# Patient Record
Sex: Male | Born: 1970
Health system: Southern US, Community
[De-identification: ages and names within clinical notes are randomized; demographics above are authoritative.]

## PROBLEM LIST (undated history)

## (undated) DIAGNOSIS — I8390 Asymptomatic varicose veins of unspecified lower extremity: Secondary | ICD-10-CM

## (undated) HISTORY — PX: BACK SURGERY: SHX140

## (undated) HISTORY — PX: VARICOSE VEIN SURGERY: SHX832

## (undated) HISTORY — DX: Asymptomatic varicose veins of unspecified lower extremity: I83.90

---

## 2006-05-23 ENCOUNTER — Ambulatory Visit: Payer: Self-pay | Admitting: Internal Medicine

## 2006-05-23 LAB — CONVERTED CEMR LAB
ALT: 25 units/L (ref 0–40)
Calcium: 9.8 mg/dL (ref 8.4–10.5)
Chloride: 105 meq/L (ref 96–112)
Cholesterol: 164 mg/dL (ref 0–200)
Creatinine, Ser: 1.2 mg/dL (ref 0.4–1.5)
Glucose, Bld: 77 mg/dL (ref 70–99)
Hemoglobin: 14.7 g/dL (ref 13.0–17.0)
MCHC: 33.9 g/dL (ref 30.0–36.0)
MCV: 90.7 fL (ref 78.0–100.0)
Platelets: 209 10*3/uL (ref 150–400)
Potassium: 4.4 meq/L (ref 3.5–5.1)
RBC: 4.77 M/uL (ref 4.22–5.81)
TSH: 1.59 microintl units/mL (ref 0.35–5.50)

## 2009-02-10 ENCOUNTER — Ambulatory Visit: Payer: Self-pay | Admitting: Internal Medicine

## 2009-02-10 DIAGNOSIS — M549 Dorsalgia, unspecified: Secondary | ICD-10-CM | POA: Insufficient documentation

## 2009-02-10 LAB — CONVERTED CEMR LAB: Glucose, Bld: 88 mg/dL

## 2009-03-12 ENCOUNTER — Ambulatory Visit: Payer: Self-pay | Admitting: Internal Medicine

## 2009-03-17 ENCOUNTER — Encounter: Admission: RE | Admit: 2009-03-17 | Discharge: 2009-03-17 | Payer: Self-pay | Admitting: Internal Medicine

## 2009-03-20 ENCOUNTER — Ambulatory Visit: Payer: Self-pay | Admitting: Internal Medicine

## 2009-03-26 ENCOUNTER — Ambulatory Visit: Payer: Self-pay | Admitting: Internal Medicine

## 2009-03-31 ENCOUNTER — Encounter: Payer: Self-pay | Admitting: Internal Medicine

## 2009-03-31 LAB — CONVERTED CEMR LAB
BUN: 17 mg/dL (ref 6–23)
Cholesterol: 163 mg/dL (ref 0–200)
Creatinine, Ser: 1.1 mg/dL (ref 0.4–1.5)
Eosinophils Relative: 1.7 % (ref 0.0–5.0)
GFR calc non Af Amer: 80.31 mL/min (ref >60)
HCT: 40.9 % (ref 39.0–52.0)
HDL: 49.2 mg/dL (ref >39.00)
LDL Cholesterol: 108 mg/dL — ABNORMAL HIGH (ref 0–99)
Monocytes Relative: 7.2 % (ref 3.0–12.0)
Neutrophils Relative %: 60.3 % (ref 43.0–77.0)
Platelets: 180 10*3/uL (ref 150.0–400.0)
Potassium: 4.7 meq/L (ref 3.5–5.1)
Triglycerides: 30 mg/dL (ref 0.0–149.0)
VLDL: 6 mg/dL (ref 0.0–40.0)
WBC: 5.5 10*3/uL (ref 4.5–10.5)

## 2009-04-21 ENCOUNTER — Encounter: Payer: Self-pay | Admitting: Internal Medicine

## 2010-10-04 ENCOUNTER — Telehealth: Payer: Self-pay | Admitting: Internal Medicine

## 2010-10-04 NOTE — Telephone Encounter (Signed)
Left msg for return call

## 2010-10-04 NOTE — Telephone Encounter (Signed)
Spoke w/ pt informed that no appt for today advised UC pt ok'd information.

## 2010-10-04 NOTE — Telephone Encounter (Signed)
Patient has temp 101.9 - cough started yesterday - runny nose - muscle pain

## 2010-11-02 NOTE — Telephone Encounter (Signed)
error 

## 2011-02-14 ENCOUNTER — Encounter: Payer: Self-pay | Admitting: Internal Medicine

## 2011-03-02 ENCOUNTER — Encounter: Payer: Self-pay | Admitting: Internal Medicine

## 2011-03-02 ENCOUNTER — Ambulatory Visit (INDEPENDENT_AMBULATORY_CARE_PROVIDER_SITE_OTHER): Payer: BC Managed Care – PPO | Admitting: Internal Medicine

## 2011-03-02 DIAGNOSIS — M549 Dorsalgia, unspecified: Secondary | ICD-10-CM

## 2011-03-02 DIAGNOSIS — I8390 Asymptomatic varicose veins of unspecified lower extremity: Secondary | ICD-10-CM | POA: Insufficient documentation

## 2011-03-02 DIAGNOSIS — Z0000 Encounter for general adult medical examination without abnormal findings: Secondary | ICD-10-CM

## 2011-03-02 MED ORDER — PREDNISONE 10 MG PO TABS: ORAL_TABLET | ORAL | Status: AC

## 2011-03-02 NOTE — Progress Notes (Signed)
  Subjective:    Patient ID: Jeffery Rose, male    DOB: 03-11-1971, 40 y.o.   MRN: [id number]  HPI  CPX   Past Medical History: Varicose veins R leg , onset about 2005  Past Surgical History: no major surgeries   Family History: DM - no CAD - no stroke - no HTN - no colon Ca - no prostate Ca - no both parents alive & well  Social History: Married 1 child Occupation: Sports administrator Biomedical engineer) Former Smoker  Alcohol use-yes (couple beers weekly) Drug use-no Regular exercise--some    Review of Systems  Respiratory: Negative for cough and shortness of breath.   Cardiovascular: Negative for chest pain.  Gastrointestinal: Negative for nausea, vomiting, diarrhea and blood in stool.  Genitourinary: Negative for dysuria, hematuria and difficulty urinating.  long h/o varicose veins, lately more prominent, itching some, no bleed Also had mechanical back pain, saw ortho 2010, had a MRI, was Rx PT, did better x 1.5 years, lately having some aches, L sided, some radiation to L buttock and leg; doing self PT    Objective:   Physical Exam  Constitutional: He is oriented to person, place, and time. He appears well-developed. No distress.  HENT:  Head: Normocephalic and atraumatic.  Neck: No thyromegaly present.  Cardiovascular: Normal rate, regular rhythm and normal heart sounds.   No murmur heard.      Nl femoral and pedal pulses   Pulmonary/Chest: Effort normal and breath sounds normal. No respiratory distress. He has no wheezes. He has no rales.  Abdominal: Soft. Bowel sounds are normal. He exhibits no distension. There is no tenderness. There is no rebound and no guarding.  Musculoskeletal: He exhibits no edema.       Prominent varicose veins around R knee, no phlebitis on exam  Neurological: He is alert and oriented to person, place, and time.       LE motor and DTRs symetric, ? Of + streight leg test on the L  Skin: Skin is warm and dry. He is not diaphoretic.    Psychiatric: He has a normal mood and affect. His behavior is normal. Judgment and thought content normal.          Assessment & Plan:

## 2011-03-02 NOTE — Assessment & Plan Note (Addendum)
Offered referal to intervention radiology

## 2011-03-02 NOTE — Assessment & Plan Note (Addendum)
Td 07 Labs Diet exercise discussed , ability to exercise may be limited by back ache

## 2011-03-02 NOTE — Patient Instructions (Addendum)
Came back fasting within a week: FLP CMP CBC TSH---dx v70 See Dr Leandra Kern if the back is not better

## 2011-03-02 NOTE — Assessment & Plan Note (Addendum)
Rx prednisone, tylenol, heat;, to see ortho again Dr Leandra Kern again if sx persist

## 2011-03-03 ENCOUNTER — Other Ambulatory Visit: Payer: Self-pay | Admitting: Internal Medicine

## 2011-03-03 ENCOUNTER — Telehealth: Payer: Self-pay | Admitting: Internal Medicine

## 2011-03-03 DIAGNOSIS — I8390 Asymptomatic varicose veins of unspecified lower extremity: Secondary | ICD-10-CM

## 2011-03-03 DIAGNOSIS — Z0000 Encounter for general adult medical examination without abnormal findings: Secondary | ICD-10-CM

## 2011-03-03 DIAGNOSIS — E039 Hypothyroidism, unspecified: Secondary | ICD-10-CM

## 2011-03-03 DIAGNOSIS — E8881 Metabolic syndrome: Secondary | ICD-10-CM

## 2011-03-03 NOTE — Telephone Encounter (Signed)
done

## 2011-03-04 ENCOUNTER — Other Ambulatory Visit (INDEPENDENT_AMBULATORY_CARE_PROVIDER_SITE_OTHER): Payer: BC Managed Care – PPO

## 2011-03-04 DIAGNOSIS — Z0000 Encounter for general adult medical examination without abnormal findings: Secondary | ICD-10-CM

## 2011-03-04 LAB — LIPID PANEL
HDL: 52.4 mg/dL (ref >39.00)
Total CHOL/HDL Ratio: 3
VLDL: 4.2 mg/dL (ref 0.0–40.0)

## 2011-03-04 LAB — TSH: TSH: 1.1 u[IU]/mL (ref 0.35–5.50)

## 2011-03-04 LAB — CBC WITH DIFFERENTIAL/PLATELET
Eosinophils Relative: 2.3 % (ref 0.0–5.0)
HCT: 41.6 % (ref 39.0–52.0)
Lymphs Abs: 1.6 10*3/uL (ref 0.7–4.0)
MCV: 90.8 fl (ref 78.0–100.0)
Monocytes Absolute: 0.5 10*3/uL (ref 0.1–1.0)
Platelets: 195 10*3/uL (ref 150.0–400.0)
WBC: 5.8 10*3/uL (ref 4.5–10.5)

## 2011-03-04 LAB — COMPREHENSIVE METABOLIC PANEL
ALT: 21 U/L (ref 0–53)
Alkaline Phosphatase: 55 U/L (ref 39–117)
Glucose, Bld: 98 mg/dL (ref 70–99)
Sodium: 140 mEq/L (ref 135–145)
Total Bilirubin: 0.9 mg/dL (ref 0.3–1.2)
Total Protein: 7.3 g/dL (ref 6.0–8.3)

## 2011-03-04 NOTE — Progress Notes (Signed)
Labs only

## 2011-03-29 ENCOUNTER — Ambulatory Visit
Admission: RE | Admit: 2011-03-29 | Discharge: 2011-03-29 | Disposition: A | Discharge status: Home / Self Care | Payer: BC Managed Care – PPO | Source: Ambulatory Visit | Attending: Internal Medicine | Admitting: Internal Medicine

## 2011-03-29 ENCOUNTER — Other Ambulatory Visit: Payer: BC Managed Care – PPO

## 2011-03-29 DIAGNOSIS — I8390 Asymptomatic varicose veins of unspecified lower extremity: Secondary | ICD-10-CM

## 2011-03-30 ENCOUNTER — Other Ambulatory Visit: Payer: Self-pay | Admitting: Interventional Radiology

## 2011-03-30 DIAGNOSIS — I83811 Varicose veins of right lower extremities with pain: Secondary | ICD-10-CM

## 2011-05-27 ENCOUNTER — Ambulatory Visit (INDEPENDENT_AMBULATORY_CARE_PROVIDER_SITE_OTHER): Payer: BC Managed Care – PPO | Admitting: Internal Medicine

## 2011-05-27 ENCOUNTER — Encounter: Payer: Self-pay | Admitting: Internal Medicine

## 2011-05-27 VITALS — BP 122/76 | HR 97 | Temp 99.3°F | Wt 172.6 lb

## 2011-05-27 DIAGNOSIS — R509 Fever, unspecified: Secondary | ICD-10-CM

## 2011-05-27 DIAGNOSIS — R112 Nausea with vomiting, unspecified: Secondary | ICD-10-CM

## 2011-05-27 DIAGNOSIS — J069 Acute upper respiratory infection, unspecified: Secondary | ICD-10-CM

## 2011-05-27 DIAGNOSIS — J029 Acute pharyngitis, unspecified: Secondary | ICD-10-CM

## 2011-05-27 LAB — CBC WITH DIFFERENTIAL/PLATELET
Basophils Relative: 0 % (ref 0–1)
Eosinophils Absolute: 0 10*3/uL (ref 0.0–0.7)
Eosinophils Relative: 0 % (ref 0–5)
HCT: 39 % (ref 39.0–52.0)
Hemoglobin: 13.1 g/dL (ref 13.0–17.0)
MCH: 30.4 pg (ref 26.0–34.0)
MCHC: 33.6 g/dL (ref 30.0–36.0)
MCV: 90.5 fL (ref 78.0–100.0)
Monocytes Absolute: 0.5 10*3/uL (ref 0.1–1.0)
Monocytes Relative: 5 % (ref 3–12)
Neutrophils Relative %: 86 % — ABNORMAL HIGH (ref 43–77)

## 2011-05-27 MED ORDER — AMOXICILLIN-POT CLAVULANATE 875-125 MG PO TABS: 1.0000 | ORAL_TABLET | Freq: Two times a day (BID) | ORAL | Status: AC

## 2011-05-27 NOTE — Patient Instructions (Signed)
Plain Mucinex for thick secretions ;force NON dairy fluids for next 48 hrs. Use a Neti pot daily as needed for sinus congestion 

## 2011-05-27 NOTE — Progress Notes (Signed)
  Subjective:    Patient ID: Jeffery Rose, male    DOB: 1970-08-15, 40 y.o.   MRN: [id number]  HPI Respiratory tract infection Onset/symptoms:11/22 nasal congestion & ST  upon awakening Exposures (illness/environmental/extrinsic):son ill recently with gland swelling Progression of symptoms:glands in neck swollen 11/22 afternoon Treatments/response:NSAIDS w/o benefit ;N&V 3-4 X after NSAIDS ; he has not had flu shot            Present symptoms: Fever/chills/sweats:only temp to 101 Frontal headache:no Facial pain:yes Nasal purulence:yellow Sore throat:yes Dental pain:no Wheezing/shortness of breath:no Cough/sputum:no            Review of Systems   The nausea and vomiting have resolved. He denies diarrhea, dysuria, pyuria, or hematuria. He also denies significant arthralgias or myalgias. His major complaint presence of severe weakness.  He seen an orthopedist for degenerative disc disease. This has been associated with intermittent radicular pain down the left leg. This is exacerbated with this present illness.  He's been taking amoxicillin for dental work; his last dose was 5 days ago     Objective:   Physical Exam General appearance : appears mildly ill; no acute distress or increased work of breathing is present.  No  lymphadenopathy about the head, neck, or axilla noted.   Eyes: No conjunctival inflammation or lid edema is present. There is no scleral icterus.  Ears:  External ear exam shows no significant lesions or deformities.  Otoscopic examination reveals clear canals, tympanic membranes are intact bilaterally without bulging, retraction, inflammation or discharge.TMs dull  Nose:  External nasal examination shows no deformity or inflammation. Nasal mucosa are dry without lesions or exudates. No septal dislocation .No obstruction to airflow.   Oral exam: Dental hygiene is good; lips and gums are healthy appearing.There is mild - moderate oropharyngeal erythema but   exudate noted.   Neck:  No deformities,  masses, or tenderness noted.   Supple with full range of motion without pain. Both passive and active range of motion is negative for meningismus  Heart:  Normal rate and regular rhythm. S1 and S2 normal without gallop, murmur, click, rub or other extra sounds.   Lungs:Chest clear to auscultation; no wheezes, rhonchi,rales ,or rubs present.No increased work of breathing.    MS/Extremities:  No cyanosis, edema, or clubbing  noted. No significant joint symptoms are present. Straight leg raising is negative. He is able to lie back and sit up without help. He is wearing a back brace   Skin: Warm but damp  w/o jaundice or tenting.          Assessment & Plan:  #1 upper respiratory tract infection with congestion, nasal purulence and pharyngitis.Beta strep negative.  #2 nausea and vomiting after nonsteroidal ingestion.  #3 flare of radicular pain left leg present illness. Negative straight leg raising.  #4 No myalgias, arthralgias or cough  to suggest influenza; he has not had flu shot.

## 2011-06-13 ENCOUNTER — Telehealth: Payer: Self-pay | Admitting: Emergency Medicine

## 2011-06-13 NOTE — Telephone Encounter (Signed)
CALLED PT TO SET UP F/U CONSERVATIVE TX FOR VEINS W/ DR SHICK, PT IS HAVING PROBLEMS WITH HIS BACK AND WILL CONTACT us WHEN READY TO SET UP F/U APPT.

## 2011-06-15 ENCOUNTER — Telehealth: Payer: Self-pay | Admitting: Radiology

## 2011-06-15 NOTE — Telephone Encounter (Signed)
Called pt to schedule 3 mo f/u app't for conservative Rx of symptomatic varicose veins of RLE.  Pt states that he will call back if/when ready to schedule app't.

## 2011-06-30 ENCOUNTER — Telehealth: Payer: Self-pay | Admitting: Emergency Medicine

## 2011-06-30 NOTE — Telephone Encounter (Signed)
CALLED PT TO SET UP F/U CONSERVATIVE TX W/ DR [facility] FOR VARICOSE VEINS.   HE WILL CALL TO SET UP APPT AFTER HE TAKES CARE OF HIS BACK PROBLEMS.

## 2011-07-07 ENCOUNTER — Telehealth: Payer: Self-pay

## 2011-07-07 NOTE — Telephone Encounter (Signed)
Pt left a message stating he is hurting and not sleeping? Pt stated he has chills and feels like he may have an ear infection? I called patient back and informed him that MD's schedule is full today but we could get an appt at [facility]. Pt stated no thanks that's ok I will go some where else. I informed him I was sorry and we could get him set up at another location and pt stated no that's okay.

## 2013-06-11 ENCOUNTER — Encounter: Payer: Self-pay | Admitting: Internal Medicine

## 2013-06-11 ENCOUNTER — Ambulatory Visit (INDEPENDENT_AMBULATORY_CARE_PROVIDER_SITE_OTHER): Payer: Managed Care, Other (non HMO) | Admitting: Internal Medicine

## 2013-06-11 VITALS — BP 102/64 | HR 65 | Temp 97.5°F | Ht 70.0 in | Wt 177.0 lb

## 2013-06-11 DIAGNOSIS — Z0000 Encounter for general adult medical examination without abnormal findings: Secondary | ICD-10-CM

## 2013-06-11 DIAGNOSIS — Z23 Encounter for immunization: Secondary | ICD-10-CM

## 2013-06-11 NOTE — Progress Notes (Signed)
Pre visit review using our clinic review tool, if applicable. No additional management support is needed unless otherwise documented below in the visit note. 

## 2013-06-11 NOTE — Patient Instructions (Signed)
Please make an appointment for blood work, FLP, CMP, CBC, TSH---- dx v70   Next visit for a physical exam   fasting, in  1 year Please make an appointment

## 2013-06-11 NOTE — Progress Notes (Signed)
   Subjective:    Patient ID: Jeffery Rose, male    DOB: 1970/07/25, 42 y.o.   MRN: [id number]  HPI CPX  Past Medical History  Diagnosis Date  . Varicose veins     R leg    Past Surgical History  Procedure Laterality Date  . Back surgery      2012, Dr Danielle Dess  . Varicose vein surgery      R, 2013   History   Social History  . Marital Status: Married    Spouse Name: N/A    Number of Children: 1  . Years of Education: N/A   Occupational History  . restaurant owner     Social History Main Topics  . Smoking status: Former Games developer  . Smokeless tobacco: Never Used  . Alcohol Use: Yes     Comment: couple beers weekly  . Drug Use: No  . Sexual Activity: Not on file   Other Topics Concern  . Not on file   Social History Narrative   Original from [place], parents still there            Family History: DM - no CAD - no stroke - no HTN - no colon Ca - no prostate Ca - no both parents alive & well     Review of Systems Diet-- trying to eat healthy Exercise-- trying to exercise but unable to be consistent No  CP, SOB, lower extremity edema Denies  nausea, vomiting diarrhea Denies  blood in the stools (-) cough, sputum production, (-) wheezing  No dysuria, gross hematuria, difficulty urinating   No anxiety, depression      Objective:   Physical Exam BP 102/64  Pulse 65  Temp(Src) 97.5 F (36.4 C)  Ht 5\' 10"  (1.778 m)  Wt 177 lb (80.287 kg)  BMI 25.40 kg/m2  SpO2 97% General -- alert, well-developed, NAD.  Neck --no thyromegaly Lungs -- normal respiratory effort, no intercostal retractions, no accessory muscle use, and normal breath sounds.  Heart-- normal rate, regular rhythm, no murmur.  Abdomen-- Not distended, good bowel sounds,soft, non-tender. Extremities-- no pretibial edema bilaterally  Neurologic--  alert & oriented X3. Speech normal, gait normal, strength normal in all extremities.  Psych-- Cognition and judgment appear intact. Cooperative  with normal attention span and concentration. No anxious appearing , no depressed appearing.      Assessment & Plan:

## 2013-06-11 NOTE — Assessment & Plan Note (Addendum)
Td 07 Flu shot today Never had a cscope  EKG nsr Labs Diet exercise discussed   STE discussed as well

## 2013-06-12 ENCOUNTER — Other Ambulatory Visit (INDEPENDENT_AMBULATORY_CARE_PROVIDER_SITE_OTHER): Payer: Managed Care, Other (non HMO)

## 2013-06-12 DIAGNOSIS — Z0000 Encounter for general adult medical examination without abnormal findings: Secondary | ICD-10-CM

## 2013-06-12 LAB — COMPREHENSIVE METABOLIC PANEL
ALT: 25 U/L (ref 0–53)
AST: 21 U/L (ref 0–37)
Albumin: 4.3 g/dL (ref 3.5–5.2)
Alkaline Phosphatase: 49 U/L (ref 39–117)
Calcium: 8.7 mg/dL (ref 8.4–10.5)
Chloride: 105 mEq/L (ref 96–112)
Potassium: 4.5 mEq/L (ref 3.5–5.1)
Sodium: 140 mEq/L (ref 135–145)

## 2013-06-12 LAB — CBC WITH DIFFERENTIAL/PLATELET
Basophils Absolute: 0 10*3/uL (ref 0.0–0.1)
Eosinophils Absolute: 0.1 10*3/uL (ref 0.0–0.7)
Hemoglobin: 13.4 g/dL (ref 13.0–17.0)
Lymphocytes Relative: 15.6 % (ref 12.0–46.0)
MCHC: 33.4 g/dL (ref 30.0–36.0)
MCV: 89.9 fl (ref 78.0–100.0)
Monocytes Absolute: 0.4 10*3/uL (ref 0.1–1.0)
Neutro Abs: 3.4 10*3/uL (ref 1.4–7.7)
Neutrophils Relative %: 73.9 % (ref 43.0–77.0)
RDW: 12.9 % (ref 11.5–14.6)

## 2013-06-12 LAB — LIPID PANEL
LDL Cholesterol: 111 mg/dL — ABNORMAL HIGH (ref 0–99)
Total CHOL/HDL Ratio: 3

## 2013-06-17 ENCOUNTER — Encounter: Payer: Self-pay | Admitting: *Deleted

## 2013-06-25 IMAGING — US IR EVAL AND MANAGEMENT
1 series · 13 of 24 positions shown · non-contrast
Comparison: none

NEW PATIENT OFFICE VISIT - LEVEL II ([id number])

Chief Complaint:  Right lower extremity enlarging painful varicose
veins.
HISTORY: 40-year-old healthy male with chronic right lower
extremity varicose veins.  The right leg varicosities have enlarged
over the last 6 months and have become painful.  He has had no
prior treatments for varicose veins or spider veins.  No history of
DVT or thrombophlebitis.  No prior treatments or surgeries for
varicose veins.  He is a local restaurant owner and works very long
hours standing for approximately 10-12 hours a day.  He reports
slow progression of right lower extremity pain, fatigue and heavy
sensation.  There is associated itching, edema and restless leg
syndrome.  No pain relief with elevation.  He currently is not
wearing prescription strength compression stockings.  He takes
occasional Advil for pain relief.

[Series 1: ir eval and management · 13 of 24 slices shown]
[im 1/24]
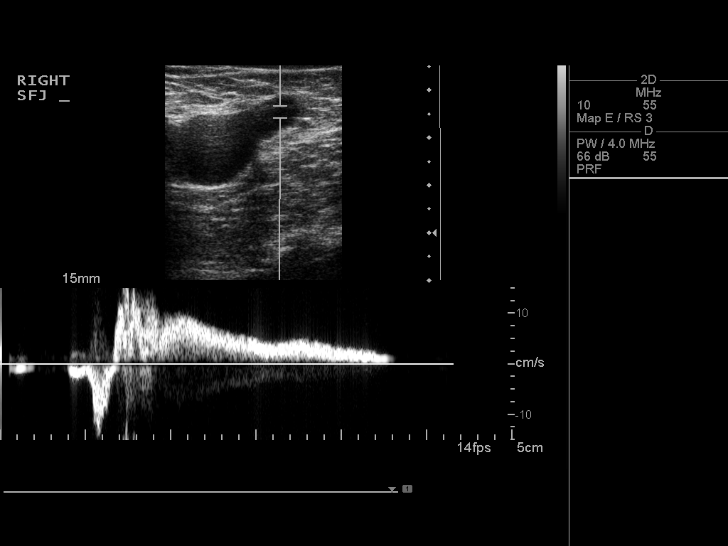
[im 3/24]
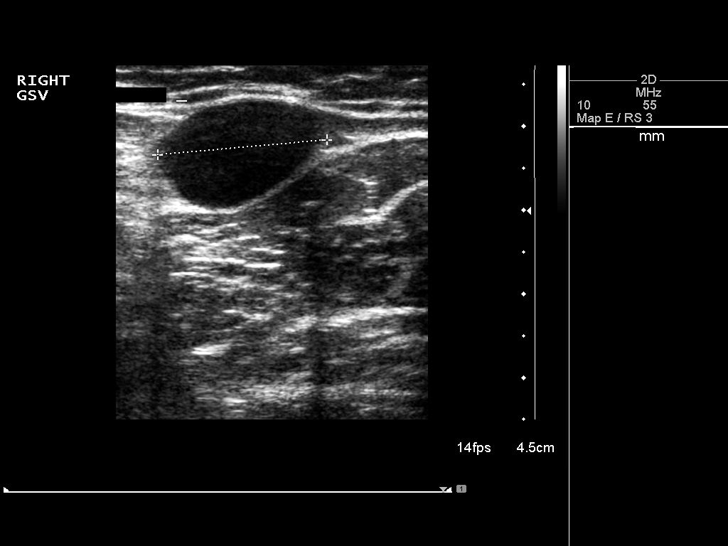
[im 5/24]
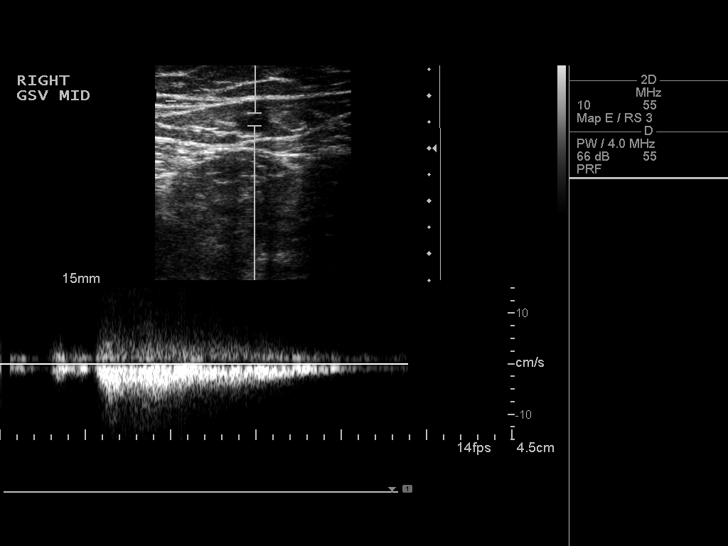
[im 7/24]
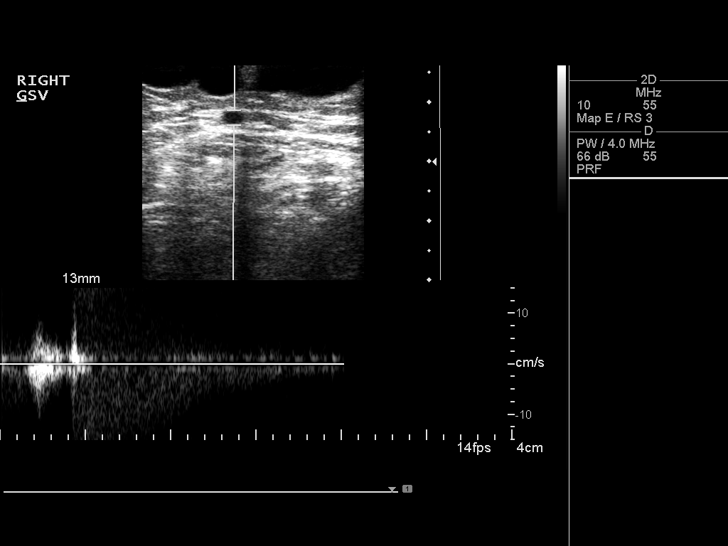
[im 9/24]
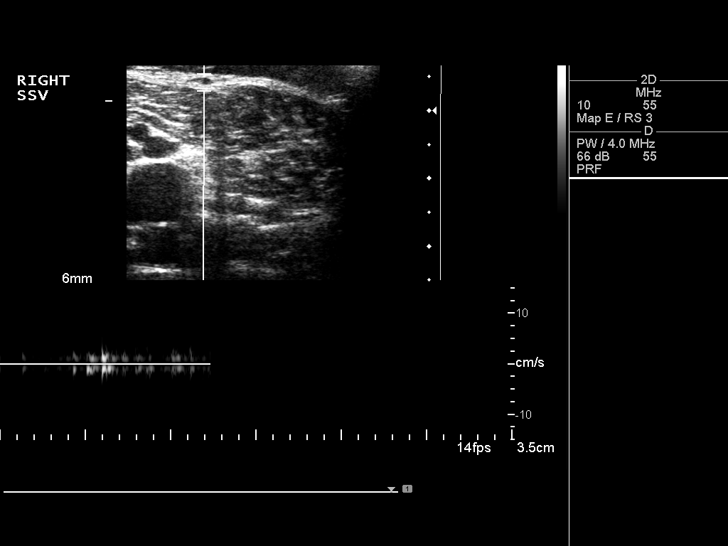
[im 11/24]
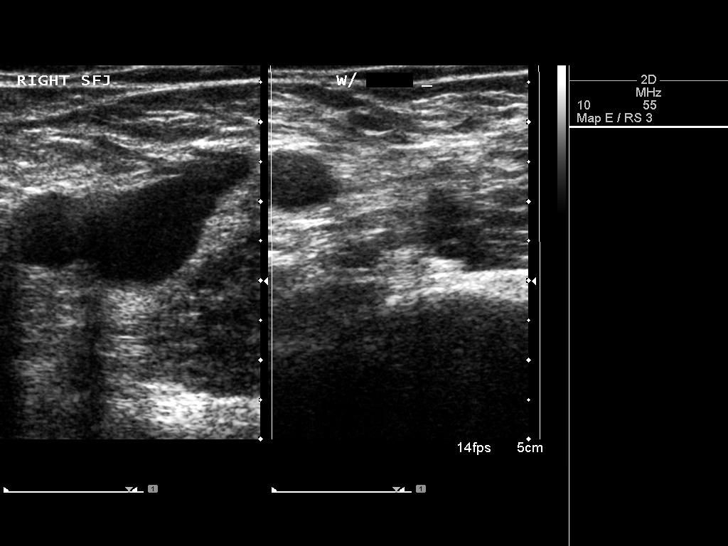
[im 13/24]
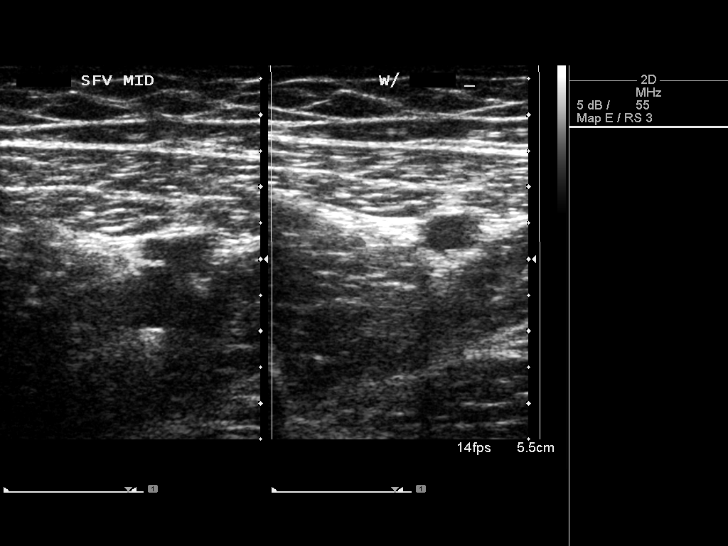
[im 14/24]
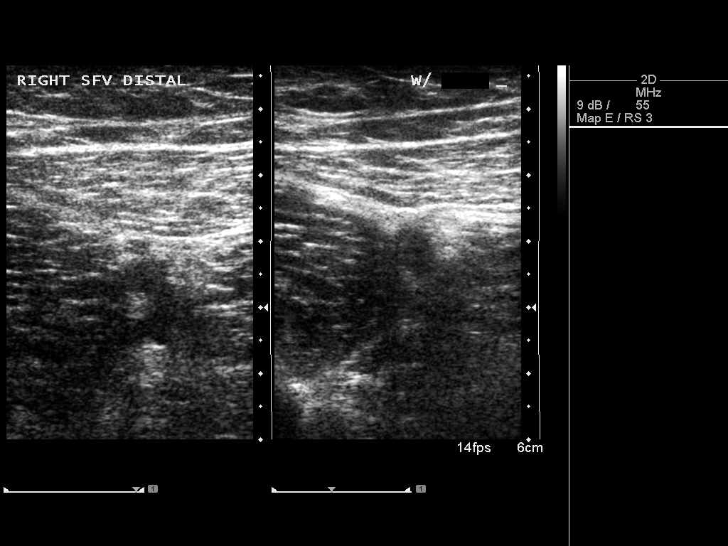
[im 16/24]
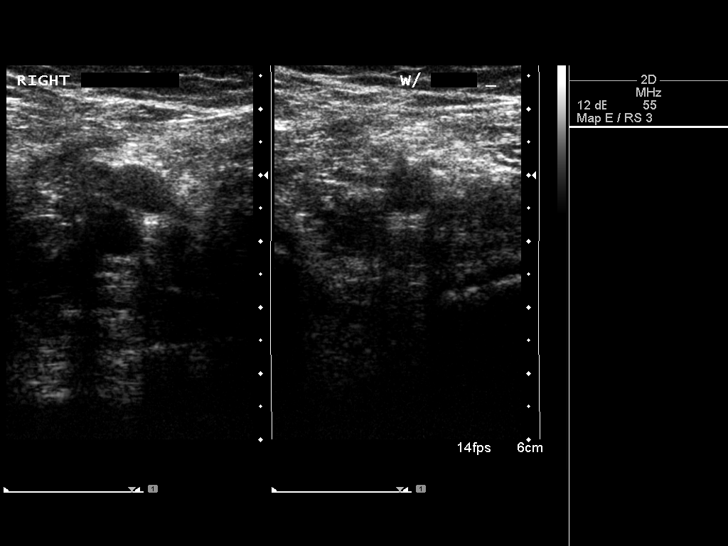
[im 18/24]
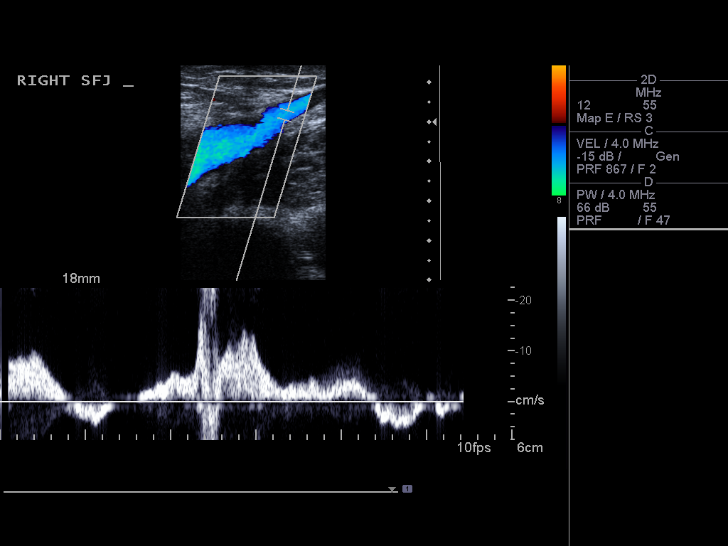
[im 20/24]
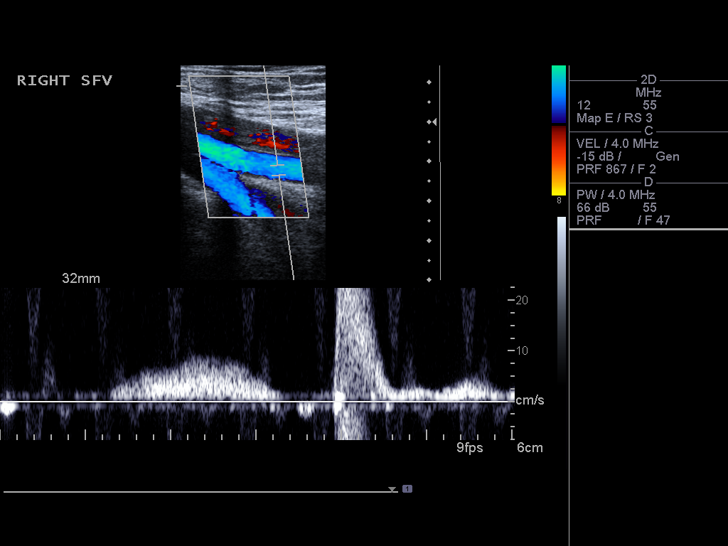
[im 22/24]
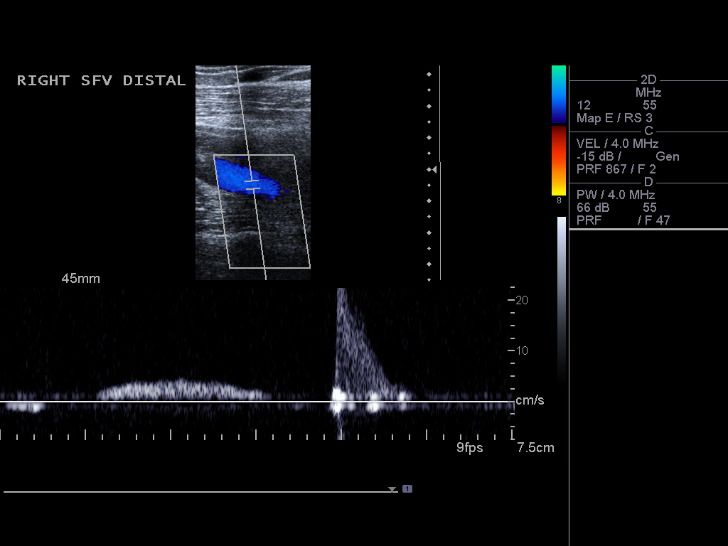
[im 24/24]
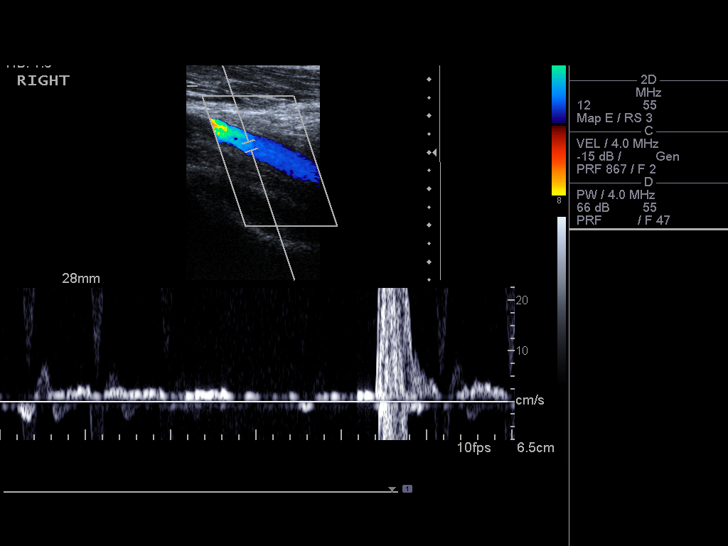

[13 of 24 positions shown; findings below may reference images not displayed]

Past Medical History:  Negative

Medications:   daily multivitamin

Allergies:  No known drug allergies

Social History:  Married with one child.  Resides in [HOSPITAL].
Local restaurant owner.  Occasional alcohol.  Remote smoking
history.  Daily caffeine intake.

Family History:  Negative

Review of Systems:  Positive for the progressive right lower
extremity pain and enlarging varicosities.

Exam:

Right lower extremity:  Tortuous mildly tender compressible
varicosities noted in the medial right distal thigh extending along
the anterior and posterior calf.  No active thrombophlebitis.  No
significant peripheral edema.  These are the patient's symptomatic
varicosities.  Normal pedal pulses.

Left lower extremity:  Negative for varicosities.

Ultrasound:  This was dictated separately but confirms right G S V
venous insufficiency with an incompetent saphenofemoral junction
resulting in the symptomatic enlarging right lower extremity
varicosities.  Negative for DVT.

Today, I spent approximately 30 minutes with the the patient and in
detail reviewed the physical exam and ultrasound findings.  We
reviewed the treatment options including transcatheter laser
occlusion of the right G S V refluxing segment and injection
sclerotherapy of the varicosities.  We reviewed the risks,
benefits, expected goals, and outcomes.  We also reviewed
phlebectomy if this was necessary in the future.  He understands
the treatment options and would like to proceed with treatment
because of the progressive symptoms over last 6 months.  .

Assessment and Plan:  Significant right G S V venous insufficiency
with an incompetent saphenofemoral junction and developing
progressive right lower extremity painful varicosities.

Plan:  Initiate conservative management trial with 20/30 mmHg
compression stockings, daily exercise, and avoiding precipitating
factors.  The patient is a candidate for transcatheter laser
occlusion and varicose vein injection sclerotherapy.

## 2013-08-05 ENCOUNTER — Ambulatory Visit: Payer: Managed Care, Other (non HMO) | Admitting: Internal Medicine

## 2013-08-12 ENCOUNTER — Encounter: Payer: Self-pay | Admitting: Internal Medicine

## 2013-08-12 ENCOUNTER — Ambulatory Visit (INDEPENDENT_AMBULATORY_CARE_PROVIDER_SITE_OTHER): Payer: Managed Care, Other (non HMO) | Admitting: Internal Medicine

## 2013-08-12 VITALS — BP 100/66 | HR 72 | Temp 97.6°F | Wt 173.0 lb

## 2013-08-12 DIAGNOSIS — M549 Dorsalgia, unspecified: Secondary | ICD-10-CM

## 2013-08-12 DIAGNOSIS — B9789 Other viral agents as the cause of diseases classified elsewhere: Secondary | ICD-10-CM

## 2013-08-12 DIAGNOSIS — B349 Viral infection, unspecified: Secondary | ICD-10-CM

## 2013-08-12 NOTE — Assessment & Plan Note (Signed)
Takes Advil OTC as needed, daily. Diet is okay as long as he doesn't have GI side effects which were discussed with him. Take medication with food

## 2013-08-12 NOTE — Progress Notes (Signed)
   Subjective:    Patient ID: Jeffery Rose, male    DOB: 1971-05-21, 43 y.o.   MRN: [id number]  DOS:  08/12/2013 Acute visit  ,we discussed the following issues  Developed a cold 4 weeks ago: Intense, abundant mucus production, he took OTC  Mucinex, feels better particularly in the last few days, still has mild chest congestion and likes to be sure his not having something serious. Also take Advil once daily for back ache, is that okay?.   Past Medical History  Diagnosis Date  . Varicose veins     R leg     Past Surgical History  Procedure Laterality Date  . Back surgery      2012, Dr Danielle DessElsner  . Varicose vein surgery      R, 2013    History   Social History  . Marital Status: Married    Spouse Name: N/A    Number of Children: 1  . Years of Education: N/A   Occupational History  . restaurant owner     Social History Main Topics  . Smoking status: Former Games developermoker  . Smokeless tobacco: Never Used  . Alcohol Use: Yes     Comment: couple beers weekly  . Drug Use: No  . Sexual Activity: Not on file   Other Topics Concern  . Not on file   Social History Narrative   Original from [place], parents still there             ROS Did not have fever or chills. No nausea, vomiting, diarrhea. No myalgias.      Objective:   Physical Exam BP 100/66  Pulse 72  Temp(Src) 97.6 F (36.4 C)  Wt 173 lb (78.472 kg)  SpO2 96% General -- alert, well-developed, NAD.   HEENT-- Not pale. TM R normal, L: covered by wax, bi d/c. Throat symmetric, no redness or discharge. Face symmetric, sinuses not tender to palpation. Nose not congested.  Lungs -- normal respiratory effort, no intercostal retractions, no accessory muscle use, and normal breath sounds.  Heart-- normal rate, regular rhythm, no murmur.   Extremities-- no pretibial edema bilaterally  Neurologic--  alert & oriented X3. Speech normal, gait normal, strength normal in all extremities.  Psych-- Cognition and judgment  appear intact. Cooperative with normal attention span and concentration. No anxious or depressed appearing.     Assessment & Plan:   Viral syndrome, resolving, to take Mucinex /nasal spray for residual symptoms, if not completely well in few days patient will call us.

## 2013-08-12 NOTE — Patient Instructions (Signed)
If  cough, take Mucinex DM twice a day as needed  If  congestion use OTC Nasocort: 2 nasal sprays on each side of the nose daily until you feel better Call if no back to normal in few days

## 2013-08-12 NOTE — Progress Notes (Signed)
Pre visit review using our clinic review tool, if applicable. No additional management support is needed unless otherwise documented below in the visit note. 

## 2015-06-25 ENCOUNTER — Ambulatory Visit (INDEPENDENT_AMBULATORY_CARE_PROVIDER_SITE_OTHER): Payer: PPO | Admitting: Family Medicine

## 2015-06-25 ENCOUNTER — Encounter: Payer: Self-pay | Admitting: Family Medicine

## 2015-06-25 VITALS — BP 108/68 | HR 74 | Temp 98.6°F | Wt 178.4 lb

## 2015-06-25 DIAGNOSIS — J0110 Acute frontal sinusitis, unspecified: Secondary | ICD-10-CM

## 2015-06-25 DIAGNOSIS — R05 Cough: Secondary | ICD-10-CM

## 2015-06-25 DIAGNOSIS — R059 Cough, unspecified: Secondary | ICD-10-CM

## 2015-06-25 MED ORDER — GUAIFENESIN-CODEINE 100-10 MG/5ML PO SYRP: ORAL_SOLUTION | ORAL | Status: DC

## 2015-06-25 MED ORDER — LEVOCETIRIZINE DIHYDROCHLORIDE 5 MG PO TABS: 5.0000 mg | ORAL_TABLET | Freq: Every evening | ORAL | Status: DC

## 2015-06-25 MED ORDER — AMOXICILLIN-POT CLAVULANATE 875-125 MG PO TABS: 1.0000 | ORAL_TABLET | Freq: Two times a day (BID) | ORAL | Status: DC

## 2015-06-25 NOTE — Patient Instructions (Signed)

## 2015-06-25 NOTE — Progress Notes (Signed)
Check labs.  Adjust meds prnCheck labs.  Adjust meds prnCheck labs.  Adjust meds prn Patient ID: Jeffery Rose, male    DOB: 1971/01/02  Age: 44 y.o. MRN: [id number]    Subjective:  Subjective HPI Jeffery Rose presents for c/o cough and congestion x 1 week.  Cough is keeping him up  Review of Systems  Constitutional: Positive for chills. Negative for fever.  HENT: Positive for congestion, postnasal drip, rhinorrhea and sinus pressure.   Respiratory: Positive for cough, chest tightness, shortness of breath and wheezing.   Cardiovascular: Negative for chest pain, palpitations and leg swelling.  Allergic/Immunologic: Negative for environmental allergies.    History Past Medical History  Diagnosis Date  . Varicose veins     R leg     He has past surgical history that includes Back surgery and Varicose vein surgery.   His family history is negative for Diabetes, Coronary artery disease, Stroke, Hypertension, Colon cancer, and Prostate cancer.He reports that he has quit smoking. He has never used smokeless tobacco. He reports that he drinks alcohol. He reports that he does not use illicit drugs.  Current Outpatient Prescriptions on File Prior to Visit  Medication Sig Dispense Refill  . Multiple Vitamins-Minerals (ADULT GUMMY) CHEW Chew 2 each by mouth daily.       No current facility-administered medications on file prior to visit.     Objective:  Objective Physical Exam  Constitutional: He is oriented to person, place, and time. He appears well-developed and well-nourished. No distress.  HENT:  Head: Normocephalic and atraumatic.  Right Ear: External ear normal.  Left Ear: External ear normal.  Nose: Right sinus exhibits maxillary sinus tenderness and frontal sinus tenderness. Left sinus exhibits maxillary sinus tenderness and frontal sinus tenderness.  Mouth/Throat: Posterior oropharyngeal erythema present. No oropharyngeal exudate or posterior oropharyngeal edema.  +  PND + errythema  Eyes: Conjunctivae and EOM are normal. Pupils are equal, round, and reactive to light. Right eye exhibits no discharge. Left eye exhibits no discharge.  Neck: Normal range of motion. Neck supple. No JVD present. No thyromegaly present.  Cardiovascular: Normal rate, regular rhythm, normal heart sounds and intact distal pulses.  Exam reveals no gallop and no friction rub.   No murmur heard. Pulmonary/Chest: Effort normal and breath sounds normal. No respiratory distress. He has no wheezes. He has no rales. He exhibits no tenderness.  Abdominal: Soft. Bowel sounds are normal. He exhibits no distension and no mass. There is no tenderness. There is no rebound and no guarding.  Genitourinary: Rectum normal, prostate normal and penis normal. Guaiac negative stool.  Musculoskeletal: Normal range of motion. He exhibits no edema or tenderness.  Lymphadenopathy:    He has no cervical adenopathy.  Neurological: He is alert and oriented to person, place, and time. He displays normal reflexes. He exhibits normal muscle tone.  Skin: Skin is warm and dry. No rash noted. He is not diaphoretic. No erythema. No pallor.  Psychiatric: He has a normal mood and affect. His behavior is normal. Judgment and thought content normal.   BP 108/68 mmHg  Pulse 74  Temp(Src) 98.6 F (37 C) (Oral)  Wt 178 lb 6.4 oz (80.922 kg)  SpO2 97% Wt Readings from Last 3 Encounters:  06/25/15 178 lb 6.4 oz (80.922 kg)  08/12/13 173 lb (78.472 kg)  06/11/13 177 lb (80.287 kg)     Lab Results  Component Value Date   WBC 4.7 06/12/2013   HGB 13.4 06/12/2013   HCT 40.1  06/12/2013   PLT 159.0 06/12/2013   GLUCOSE 98 06/12/2013   CHOL 168 06/12/2013   TRIG 32.0 06/12/2013   HDL 50.70 06/12/2013   LDLCALC 111* 06/12/2013   ALT 25 06/12/2013   AST 21 06/12/2013   NA 140 06/12/2013   K 4.5 06/12/2013   CL 105 06/12/2013   CREATININE 1.1 06/12/2013   BUN 16 06/12/2013   CO2 29 06/12/2013   TSH 1.32  06/12/2013    US Venous Img Lower Unilateral Right  03/29/2011  *RADIOLOGY REPORT* Clinical Data: Right lower extremity enlarging symptomatic varicose veins RIGHT LOWER EXTREMITY VENOUS DUPLEX ULTRASOUND Technique:  Gray-scale sonography with graded compression, as well as color Doppler and duplex ultrasound, were performed to evaluate the deep and superficial veins of the right lower extremity. Spectral Doppler was utilized to evaluate flow at rest and with distal augmentation maneuvers.  A complete superficial venous insufficiency exam was performed in the upright standing position. I personally performed the technical portion of the exam. Comparison:  None. Findings: Right Lower Extremity:  Right saphenofemoral junction is dilated and incompetent with venous insufficiency.  Right G S V is dilated proximally to a maximal diameter of 2 cm with diffuse venous insufficiency throughout the right thigh, across the knee and into the calf region.  There are several branching varicosities off of the right G S V refluxing segment in the distal thigh, around the knee and in the proximal to posterior calf.  This is the patient's dominant refluxing pathway.  No significant tortuosity or aneurysmal segments.  Right G S V is amenable to transcatheter laser occlusion.  Right small saphenous vein is normal without dilatation or insufficiency.  Right common femoral, femoral, and popliteal veins are patent with normal compressibility, phasicity and augmentation.  No evidence of deep venous insufficiency. IMPRESSION: Positive exam for right G S V venous insufficiency with an incomponent saphenofemoral junction and associated distal thigh, knee and proximal calf varicosities. Negative for DVT. Original Report Authenticated By: Judie Petit. Ruel Favors, M.D.  [facility]  03/29/2011  *RADIOLOGY REPORT* NEW PATIENT OFFICE VISIT - LEVEL II [phone]) Chief Complaint:  Right lower extremity enlarging painful varicose veins.  History:  44 year old healthy male with chronic right lower extremity varicose veins.  The right leg varicosities have enlarged over the last 6 months and have become painful.  He has had no prior treatments for varicose veins or spider veins.  No history of DVT or thrombophlebitis.  No prior treatments or surgeries for varicose veins.  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  He reports slow progression of right lower extremity pain, fatigue and heavy sensation.  There is associated itching, edema and restless leg syndrome.  No pain relief with elevation.  He currently is not wearing prescription strength compression stockings.  He takes occasional Advil for pain relief. Past Medical History:  Negative Medications:   daily multivitamin Allergies:  No known drug allergies Social History:  Married with one child.  Resides in [place]. Corporate treasurer.  Occasional alcohol.  Remote smoking history.  Daily caffeine intake. Family History:  Negative Review of Systems:  Positive for the progressive right lower extremity pain and enlarging varicosities. Exam: Right lower extremity:  Tortuous mildly tender compressible varicosities noted in the medial right distal thigh extending along the anterior and posterior calf.  No active thrombophlebitis.  No significant peripheral edema.  These are the patient's symptomatic varicosities.  Normal pedal pulses. Left lower extremity:  Negative for varicosities. Ultrasound:  This was dictated separately but confirms right G S V venous insufficiency with an incompetent saphenofemoral junction resulting in the symptomatic enlarging right lower extremity varicosities.  Negative for DVT. Today, I spent approximately 30 minutes with the the patient and in detail reviewed the physical exam and ultrasound findings.  We reviewed the treatment options including transcatheter laser occlusion of the right G S V refluxing  segment and injection sclerotherapy of the varicosities.  We reviewed the risks, benefits, expected goals, and outcomes.  We also reviewed phlebectomy if this was necessary in the future.  He understands the treatment options and would like to proceed with treatment because of the progressive symptoms over last 6 months.  . Assessment and Plan:  Significant right G S V venous insufficiency with an incompetent saphenofemoral junction and developing progressive right lower extremity painful varicosities. Plan:  Initiate conservative management trial with 20/30 mmHg compression stockings, daily exercise, and avoiding precipitating factors.  The patient is a candidate for transcatheter laser occlusion and varicose vein injection sclerotherapy. Original Report Authenticated By: Judie PetitM. Ruel FavorsREVOR SHICK, M.D.    Assessment & Plan:  Plan I am having Mr. Derek JackMehmeti start on amoxicillin-clavulanate, levocetirizine, and guaiFENesin-codeine. I am also having him maintain his ADULT GUMMY, dextromethorphan, and dextromethorphan-guaiFENesin.  Meds ordered this encounter  Medications  . dextromethorphan (DELSYM) 30 MG/5ML liquid    Sig: Take by mouth as needed for cough.  . dextromethorphan-guaiFENesin (MUCINEX DM) 30-600 MG 12hr tablet    Sig: Take 1 tablet by mouth 2 (two) times daily.  Marland Kitchen. amoxicillin-clavulanate (AUGMENTIN) 875-125 MG tablet    Sig: Take 1 tablet by mouth 2 (two) times daily.    Dispense:  20 tablet    Refill:  0  . levocetirizine (XYZAL) 5 MG tablet    Sig: Take 1 tablet (5 mg total) by mouth every evening.    Dispense:  30 tablet    Refill:  11  . guaiFENesin-codeine (ROBITUSSIN AC) 100-10 MG/5ML syrup    Sig: 1-2 tsp po q hs prn cough    Dispense:  120 mL    Refill:  0    Problem List Items Addressed This Visit    None    Visit Diagnoses    Acute frontal sinusitis, recurrence not specified    -  Primary    Relevant Medications    dextromethorphan (DELSYM) 30 MG/5ML liquid     dextromethorphan-guaiFENesin (MUCINEX DM) 30-600 MG 12hr tablet    amoxicillin-clavulanate (AUGMENTIN) 875-125 MG tablet    levocetirizine (XYZAL) 5 MG tablet    guaiFENesin-codeine (ROBITUSSIN AC) 100-10 MG/5ML syrup    Cough        Relevant Medications    guaiFENesin-codeine (ROBITUSSIN AC) 100-10 MG/5ML syrup       Follow-up: Return if symptoms worsen or fail to improve.  Loreen FreudYvonne Lowne, DO

## 2015-06-25 NOTE — Progress Notes (Signed)
Pre visit review using our clinic review tool, if applicable. No additional management support is needed unless otherwise documented below in the visit note. 

## 2016-01-25 ENCOUNTER — Encounter: Payer: PPO | Admitting: Internal Medicine

## 2016-02-17 ENCOUNTER — Ambulatory Visit (INDEPENDENT_AMBULATORY_CARE_PROVIDER_SITE_OTHER): Payer: BLUE CROSS/BLUE SHIELD | Admitting: Internal Medicine

## 2016-02-17 ENCOUNTER — Encounter: Payer: Self-pay | Admitting: Internal Medicine

## 2016-02-17 VITALS — BP 108/66 | HR 72 | Temp 97.9°F | Resp 12 | Ht 69.75 in | Wt 175.5 lb

## 2016-02-17 DIAGNOSIS — Z23 Encounter for immunization: Secondary | ICD-10-CM

## 2016-02-17 DIAGNOSIS — Z0000 Encounter for general adult medical examination without abnormal findings: Secondary | ICD-10-CM

## 2016-02-17 LAB — CBC WITH DIFFERENTIAL/PLATELET
Basophils Absolute: 0 10*3/uL (ref 0.0–0.1)
Basophils Relative: 0.5 % (ref 0.0–3.0)
EOS PCT: 2.1 % (ref 0.0–5.0)
Eosinophils Absolute: 0.1 10*3/uL (ref 0.0–0.7)
HCT: 40.8 % (ref 39.0–52.0)
HEMOGLOBIN: 13.9 g/dL (ref 13.0–17.0)
Lymphocytes Relative: 30.3 % (ref 12.0–46.0)
Lymphs Abs: 2 10*3/uL (ref 0.7–4.0)
MCHC: 34.1 g/dL (ref 30.0–36.0)
MCV: 88.9 fl (ref 78.0–100.0)
MONO ABS: 0.4 10*3/uL (ref 0.1–1.0)
MONOS PCT: 6.5 % (ref 3.0–12.0)
Neutro Abs: 4 10*3/uL (ref 1.4–7.7)
Neutrophils Relative %: 60.6 % (ref 43.0–77.0)
Platelets: 225 10*3/uL (ref 150.0–400.0)
RBC: 4.59 Mil/uL (ref 4.22–5.81)
RDW: 12.7 % (ref 11.5–15.5)
WBC: 6.6 10*3/uL (ref 4.0–10.5)

## 2016-02-17 LAB — BASIC METABOLIC PANEL
BUN: 16 mg/dL (ref 6–23)
CALCIUM: 9.9 mg/dL (ref 8.4–10.5)
CO2: 32 mEq/L (ref 19–32)
CREATININE: 0.99 mg/dL (ref 0.40–1.50)
Chloride: 101 mEq/L (ref 96–112)
GFR: 87.56 mL/min (ref >60.00)
GLUCOSE: 70 mg/dL (ref 70–99)
Potassium: 4.9 mEq/L (ref 3.5–5.1)
Sodium: 139 mEq/L (ref 135–145)

## 2016-02-17 LAB — LIPID PANEL
Cholesterol: 165 mg/dL (ref 0–200)
HDL: 64.9 mg/dL (ref >39.00)
LDL Cholesterol: 85 mg/dL (ref 0–99)
NONHDL: 99.69
Total CHOL/HDL Ratio: 3
Triglycerides: 72 mg/dL (ref 0.0–149.0)
VLDL: 14.4 mg/dL (ref 0.0–40.0)

## 2016-02-17 MED ORDER — KETOCONAZOLE 2 % EX CREA: 1.0000 "application " | TOPICAL_CREAM | Freq: Every day | CUTANEOUS | 0 refills | Status: DC

## 2016-02-17 NOTE — Assessment & Plan Note (Signed)
Td  02-2016 Never had a cscope    Labs: BMP, FLP, CBC. Declined HIV. Diet exercise discussed although he is trying to eat healthy and states active.

## 2016-02-17 NOTE — Progress Notes (Signed)
Subjective:    Patient ID: Jeffery Rose, male    DOB: 12-16-1970, 45 y.o.   MRN: [id number]  DOS:  02/17/2016 Type of visit - description : CPX Interval history:  No major concerns, trying to stay active and eats healthy   Review of Systems Constitutional: No fever. No chills. No unexplained wt changes. No unusual sweats  HEENT: No dental problems, no ear discharge, no facial swelling, no voice changes. No eye discharge, no eye  redness , no  intolerance to light   Respiratory: No wheezing , no  difficulty breathing. No cough , no mucus production  Cardiovascular: No CP, no leg swelling , no  Palpitations  GI: no nausea, no vomiting, no diarrhea , no  abdominal pain.  No blood in the stools. No dysphagia, no odynophagia    Endocrine: No polyphagia, no polyuria , no polydipsia  GU: No dysuria, gross hematuria, difficulty urinating. No urinary urgency, no frequency.  Musculoskeletal: No joint swellings or unusual aches or pains  Skin: Several months history of a itchy rash at the left hand  Allergic, immunologic: + Allergies, almost year round, + nasal congestion, better with OTC Zyrtec half tablet as needed  Neurological: No dizziness no  syncope. No headaches. No diplopia, no slurred, no slurred speech, no motor deficits, no facial  Numbness  Hematological: No enlarged lymph nodes, no easy bruising , no unusual bleedings  Psychiatry: No suicidal ideas, no hallucinations, no beavior problems, no confusion.  No unusual/severe anxiety, no depression   Past Medical History:  Diagnosis Date  . Varicose veins    R leg     Past Surgical History:  Procedure Laterality Date  . BACK SURGERY     2012, Dr Danielle DessElsner  . VARICOSE VEIN SURGERY     R, 2013    Social History   Social History  . Marital status: Married    Spouse name: N/A  . Number of children: 1  . Years of education: N/A   Occupational History  . restaurant owner  [employer]   Social History Main Topics    . Smoking status: Former Games developermoker  . Smokeless tobacco: Never Used  . Alcohol use Yes     Comment: couple beers weekly  . Drug use: No  . Sexual activity: Not on file   Other Topics Concern  . Not on file   Social History Narrative   Original from [place], parents still there              Family History  Problem Relation Age of Onset  . Diabetes Neg Hx   . Coronary artery disease Neg Hx   . Stroke Neg Hx   . Hypertension Neg Hx   . Colon cancer Neg Hx   . Prostate cancer Neg Hx        Medication List       Accurate as of 02/17/16 11:59 PM. Always use your most recent med list.          cetirizine 10 MG tablet Commonly known as:  ZYRTEC Take 5-10 mg by mouth daily as needed for allergies.   ketoconazole 2 % cream Commonly known as:  NIZORAL Apply 1 application topically daily.          Objective:   Physical Exam BP 108/66 (BP Location: Left Arm, Patient Position: Sitting, Cuff Size: Normal)   Pulse 72   Temp 97.9 F (36.6 C) (Oral)   Resp 12   Ht 5' 9.75" (1.772 m)  Wt 175 lb 8 oz (79.6 kg)   SpO2 99%   BMI 25.36 kg/m   General:   Well developed, well nourished . NAD.  Neck: No  thyromegaly  HEENT:  Normocephalic . Face symmetric, atraumatic Lungs:  CTA B Normal respiratory effort, no intercostal retractions, no accessory muscle use. Heart: RRR,  no murmur.  No pretibial edema bilaterally  Abdomen:  Not distended, soft, non-tender. No rebound or rigidity.   Skin: Left hand has a patch of erythematous skin, borders are slightly elevated and scaly. No blister. Similar but much smaller patch of skin on the fifth finger. Neurologic:  alert & oriented X3.  Speech normal, gait appropriate for age and unassisted Strength symmetric and appropriate for age.  Psych: Cognition and judgment appear intact.  Cooperative with normal attention span and concentration.  Behavior appropriate. No anxious or depressed appearing.    Assessment & Plan:    Assessment Varicose veins, status post right leg surgery 2013  Plan: Here for a CPX, doing well. Allergies: Well controlled with as needed Zyrtec. If needed he could switch to Flonase. 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 nizoral  twice a day for 2 weeks, call if not better, may require oral medication. RTC one year

## 2016-02-17 NOTE — Progress Notes (Signed)
Pre visit review using our clinic review tool, if applicable. No additional management support is needed unless otherwise documented below in the visit note. 

## 2016-02-17 NOTE — Patient Instructions (Signed)
GO TO THE LAB : Get the blood work     GO TO THE FRONT DESK Schedule your next appointment for a physical  exam in one year     Use the cream twice a day for at least two weeks, call if no better

## 2016-02-18 DIAGNOSIS — Z09 Encounter for follow-up examination after completed treatment for conditions other than malignant neoplasm: Secondary | ICD-10-CM | POA: Insufficient documentation

## 2016-02-18 NOTE — Assessment & Plan Note (Signed)
Here for a CPX, doing well. Allergies: Well controlled with as needed Zyrtec. If needed he could switch to Flonase. 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 nizoral  twice a day for 2 weeks, call if not better, may require oral medication. RTC one year

## 2016-04-13 ENCOUNTER — Telehealth: Payer: Self-pay | Admitting: Internal Medicine

## 2016-04-13 DIAGNOSIS — R21 Rash and other nonspecific skin eruption: Secondary | ICD-10-CM

## 2016-04-13 NOTE — Telephone Encounter (Signed)
Caller name: Relationship to patient: Self Can be reached: [phone] Pharmacy:  Reason for call: Patient states that his rash on his hand has not gotten better after using the cream that was given to him. States Provider informed him to call back if it did not get any better

## 2016-04-14 NOTE — Telephone Encounter (Signed)
Arrange a dermatology referral, DX hand rash

## 2016-04-14 NOTE — Telephone Encounter (Signed)
Referral to dermatology has been sent. Patient states you told him that if the cream didn't work that a pill would be prescribed. Pt would like to try a pill to see if that will work.  Please advise, PC

## 2016-04-14 NOTE — Telephone Encounter (Signed)
Please advise 

## 2016-04-15 NOTE — Telephone Encounter (Signed)
Yes, he may require oral medications but I would like him  to be discuss with dermatology

## 2016-04-19 NOTE — Telephone Encounter (Signed)
Pt Is returning call    [phone]

## 2016-05-04 ENCOUNTER — Encounter: Payer: BLUE CROSS/BLUE SHIELD | Admitting: Internal Medicine

## 2017-05-03 DIAGNOSIS — L301 Dyshidrosis [pompholyx]: Secondary | ICD-10-CM | POA: Diagnosis not present

## 2018-05-09 ENCOUNTER — Encounter: Payer: BLUE CROSS/BLUE SHIELD | Admitting: Internal Medicine

## 2018-05-11 ENCOUNTER — Encounter: Payer: Self-pay | Admitting: Internal Medicine

## 2018-05-11 ENCOUNTER — Ambulatory Visit (INDEPENDENT_AMBULATORY_CARE_PROVIDER_SITE_OTHER): Payer: BLUE CROSS/BLUE SHIELD | Admitting: Internal Medicine

## 2018-05-11 VITALS — BP 122/70 | HR 68 | Temp 98.0°F | Resp 16 | Ht 69.75 in | Wt 172.0 lb

## 2018-05-11 DIAGNOSIS — Z23 Encounter for immunization: Secondary | ICD-10-CM | POA: Diagnosis not present

## 2018-05-11 DIAGNOSIS — Z0000 Encounter for general adult medical examination without abnormal findings: Secondary | ICD-10-CM

## 2018-05-11 NOTE — Patient Instructions (Signed)
  GO TO THE FRONT DESK Schedule your next appointment for a  Physical exam in 1 year  

## 2018-05-11 NOTE — Assessment & Plan Note (Addendum)
Td 2017; flu shot today CCS: never had a cscope  Prostate exam: at age 47  Labs: CMP, FLP, CBC, TSH Diet and exercise: Discussed, they have a 30-month-old baby, has not been able to exercise much but plans to go back as soon as he can. rtc 1 year

## 2018-05-11 NOTE — Progress Notes (Signed)
Subjective:    Patient ID: Jeffery Rose, male    DOB: [birth year], 47 y.o.   MRN: [id number]  DOS:  05/11/2018 Type of visit - description :  CPX Interval history: Since the last office visit in 2017 he is doing well.   Review of Systems Has hand eczema, uses a cream as needed, good results. Cerumen impaction?  Would like that checked, no symptoms  Other than above, a 14 point review of systems is negative    Past Medical History:  Diagnosis Date  . Varicose veins    R leg     Past Surgical History:  Procedure Laterality Date  . BACK SURGERY     2012, Dr Danielle Dess  . VARICOSE VEIN SURGERY     R, 2013    Social History   Socioeconomic History  . Marital status: Married    Spouse name: Not on file  . Number of children: 1  . Years of education: Not on file  . Highest education level: Not on file  Occupational History  . Occupation: Sports administrator     Employer: [employer]  Social Needs  . Financial resource strain: Not on file  . Food insecurity:    Worry: Not on file    Inability: Not on file  . Transportation needs:    Medical: Not on file    Non-medical: Not on file  Tobacco Use  . Smoking status: Former Games developer  . Smokeless tobacco: Never Used  Substance and Sexual Activity  . Alcohol use: Yes    Comment: couple beers weekly  . Drug use: No  . Sexual activity: Not on file  Lifestyle  . Physical activity:    Days per week: Not on file    Minutes per session: Not on file  . Stress: Not on file  Relationships  . Social connections:    Talks on phone: Not on file    Gets together: Not on file    Attends religious service: Not on file    Active member of club or organization: Not on file    Attends meetings of clubs or organizations: Not on file    Relationship status: Not on file  . Intimate partner violence:    Fear of current or ex partner: Not on file    Emotionally abused: Not on file    Physically abused: Not on file    Forced sexual  activity: Not on file  Other Topics Concern  . Not on file  Social History Narrative   Original from [place], parents still there              Family History  Problem Relation Age of Onset  . Diabetes Neg Hx   . Coronary artery disease Neg Hx   . Stroke Neg Hx   . Hypertension Neg Hx   . Colon cancer Neg Hx   . Prostate cancer Neg Hx      Allergies as of 05/11/2018   No Known Allergies     Medication List        Accurate as of 05/11/18  4:17 PM. Always use your most recent med list.          cetirizine 10 MG tablet Commonly known as:  ZYRTEC Take 5-10 mg by mouth daily as needed for allergies.   ketoconazole 2 % cream Commonly known as:  NIZORAL Apply 1 application topically daily.          Objective:   Physical Exam BP  122/70 (BP Location: Right Arm, Patient Position: Sitting, Cuff Size: Small)   Pulse 68   Temp 98 F (36.7 C) (Oral)   Resp 16   Ht 5' 9.75" (1.772 m)   Wt 172 lb (78 kg)   SpO2 98%   BMI 24.86 kg/m  General: Well developed, NAD, BMI noted Neck: No  thyromegaly  HEENT:  Normocephalic . Face symmetric, atraumatic.  Moderate amount of wax in both ears. Lungs:  CTA B Normal respiratory effort, no intercostal retractions, no accessory muscle use. Heart: RRR,  no murmur.  No pretibial edema bilaterally  Abdomen:  Not distended, soft, non-tender. No rebound or rigidity.   Skin: Mild dryness and scaliness in patches on the left hand Neurologic:  alert & oriented X3.  Speech normal, gait appropriate for age and unassisted Strength symmetric and appropriate for age.  Psych: Cognition and judgment appear intact.  Cooperative with normal attention span and concentration.  Behavior appropriate. No anxious or depressed appearing.     Assessment & Plan:    Assessment Back surgery 2012 Varicose vein surgery, R leg, 2013 L hand rash: saw derm, rx steroids, dx eczema ? , uses prn   PLAN: Here for CPX, doing well Hand eczema  controlled with as needed steroids. Cerumen accumulation: Recommend Debrox OTC, has no symptoms RTC 1 year

## 2018-05-12 LAB — LIPID PANEL
CHOL/HDL RATIO: 3.2 (calc) (ref <5.0)
Cholesterol: 168 mg/dL (ref <200)
HDL: 53 mg/dL (ref >40)
LDL CHOLESTEROL (CALC): 94 mg/dL
NON-HDL CHOLESTEROL (CALC): 115 mg/dL (ref <130)
Triglycerides: 116 mg/dL (ref <150)

## 2018-05-12 LAB — COMPREHENSIVE METABOLIC PANEL
AG Ratio: 1.7 (calc) (ref 1.0–2.5)
ALBUMIN MSPROF: 4.5 g/dL (ref 3.6–5.1)
ALT: 17 U/L (ref 9–46)
AST: 16 U/L (ref 10–40)
Alkaline phosphatase (APISO): 49 U/L (ref 40–115)
BUN: 18 mg/dL (ref 7–25)
CO2: 29 mmol/L (ref 20–32)
Calcium: 9 mg/dL (ref 8.6–10.3)
Chloride: 102 mmol/L (ref 98–110)
Creat: 1.09 mg/dL (ref 0.60–1.35)
Globulin: 2.7 g/dL (calc) (ref 1.9–3.7)
Glucose, Bld: 72 mg/dL (ref 65–99)
POTASSIUM: 4.2 mmol/L (ref 3.5–5.3)
SODIUM: 140 mmol/L (ref 135–146)
TOTAL PROTEIN: 7.2 g/dL (ref 6.1–8.1)
Total Bilirubin: 0.8 mg/dL (ref 0.2–1.2)

## 2018-05-12 LAB — CBC WITH DIFFERENTIAL/PLATELET
BASOS PCT: 0.4 %
Basophils Absolute: 22 cells/uL (ref 0–200)
EOS ABS: 78 {cells}/uL (ref 15–500)
Eosinophils Relative: 1.4 %
HEMATOCRIT: 39.5 % (ref 38.5–50.0)
HEMOGLOBIN: 14.1 g/dL (ref 13.2–17.1)
LYMPHS ABS: 1943 {cells}/uL (ref 850–3900)
MCH: 30.6 pg (ref 27.0–33.0)
MCHC: 35.7 g/dL (ref 32.0–36.0)
MCV: 85.7 fL (ref 80.0–100.0)
MONOS PCT: 5.2 %
MPV: 10.6 fL (ref 7.5–12.5)
NEUTROS ABS: 3265 {cells}/uL (ref 1500–7800)
Neutrophils Relative %: 58.3 %
Platelets: 217 10*3/uL (ref 140–400)
RBC: 4.61 10*6/uL (ref 4.20–5.80)
RDW: 12.5 % (ref 11.0–15.0)
Total Lymphocyte: 34.7 %
WBC: 5.6 10*3/uL (ref 3.8–10.8)
WBCMIX: 291 {cells}/uL (ref 200–950)

## 2018-05-12 LAB — TSH: TSH: 1.61 mIU/L (ref 0.40–4.50)

## 2018-05-13 NOTE — Assessment & Plan Note (Signed)
Here for CPX, doing well Hand eczema controlled with as needed steroids. Cerumen accumulation: Recommend Debrox OTC, has no symptoms RTC 1 year

## 2019-04-13 DIAGNOSIS — Z20828 Contact with and (suspected) exposure to other viral communicable diseases: Secondary | ICD-10-CM | POA: Diagnosis not present

## 2019-04-14 DIAGNOSIS — N390 Urinary tract infection, site not specified: Secondary | ICD-10-CM | POA: Diagnosis not present

## 2019-04-15 ENCOUNTER — Telehealth: Payer: Self-pay

## 2019-04-15 NOTE — Telephone Encounter (Signed)
Called patient back about after hours call from Saturday evening, he called complaining of UTI symtoms dyruria, He was advise to go to an urgent care or er. Today he reports he was seen at [facility] and was diagnosed with UTI. He was started on "some medication and was feeling much better". He was not able to provide name of medication and he did not want to schedule a follow up appointment.

## 2019-05-13 ENCOUNTER — Encounter: Payer: BLUE CROSS/BLUE SHIELD | Admitting: Internal Medicine

## 2019-05-13 ENCOUNTER — Other Ambulatory Visit: Payer: Self-pay

## 2019-05-14 ENCOUNTER — Telehealth: Payer: Self-pay

## 2019-05-14 ENCOUNTER — Ambulatory Visit (INDEPENDENT_AMBULATORY_CARE_PROVIDER_SITE_OTHER): Payer: BC Managed Care – PPO | Admitting: Internal Medicine

## 2019-05-14 ENCOUNTER — Encounter: Payer: Self-pay | Admitting: Internal Medicine

## 2019-05-14 VITALS — BP 115/71 | HR 75 | Temp 97.0°F | Resp 16 | Ht 70.0 in | Wt 177.1 lb

## 2019-05-14 DIAGNOSIS — N390 Urinary tract infection, site not specified: Secondary | ICD-10-CM | POA: Diagnosis not present

## 2019-05-14 DIAGNOSIS — Z23 Encounter for immunization: Secondary | ICD-10-CM

## 2019-05-14 DIAGNOSIS — R399 Unspecified symptoms and signs involving the genitourinary system: Secondary | ICD-10-CM | POA: Diagnosis not present

## 2019-05-14 DIAGNOSIS — Z0000 Encounter for general adult medical examination without abnormal findings: Secondary | ICD-10-CM | POA: Diagnosis not present

## 2019-05-14 NOTE — Progress Notes (Signed)
Pre visit review using our clinic review tool, if applicable. No additional management support is needed unless otherwise documented below in the visit note. 

## 2019-05-14 NOTE — Progress Notes (Signed)
Subjective:    Patient ID: Jeffery Rose, male    DOB: 1971-04-04, 48 y.o.   MRN: [id number]  DOS:  05/14/2019 Type of visit - description: Here for CPX In general feeling well. 6 weeks ago he did have a problem: Difficulty urinating, dysuria, lower abdominal discomfort, felt that he was not emptying his bladder completely. At the time did have some fever chills but no nausea, vomiting, gross hematuria or penile discharge.   [facility] diagnosed with a UTI after a urine culture, received 5 days of antibiotics and he is much better now. No further LUTS   Review of Systems  Other than above, a 14 point review of systems is negative     Past Medical History:  Diagnosis Date  . Varicose veins    R leg     Past Surgical History:  Procedure Laterality Date  . BACK SURGERY     2012, Dr Ellene Route  . VARICOSE VEIN SURGERY     R, 2013    Social History   Socioeconomic History  . Marital status: Married    Spouse name: Not on file  . Number of children: 2  . Years of education: Not on file  . Highest education level: Not on file  Occupational History  . Occupation: Regulatory affairs officer     Employer: [employer]  Social Needs  . Financial resource strain: Not on file  . Food insecurity    Worry: Not on file    Inability: Not on file  . Transportation needs    Medical: Not on file    Non-medical: Not on file  Tobacco Use  . Smoking status: Former Research scientist (life sciences)  . Smokeless tobacco: Never Used  Substance and Sexual Activity  . Alcohol use: Yes    Comment: couple beers weekly  . Drug use: No  . Sexual activity: Not on file  Lifestyle  . Physical activity    Days per week: Not on file    Minutes per session: Not on file  . Stress: Not on file  Relationships  . Social Herbalist on phone: Not on file    Gets together: Not on file    Attends religious service: Not on file    Active member of club or organization: Not on file    Attends meetings of clubs  or organizations: Not on file    Relationship status: Not on file  . Intimate partner violence    Fear of current or ex partner: Not on file    Emotionally abused: Not on file    Physically abused: Not on file    Forced sexual activity: Not on file  Other Topics Concern  . Not on file  Social History Narrative   Original from [place], parents still there   2 children    1st child is a boy autistic born 1-   Daughter born 03-2018            Family History  Problem Relation Age of Onset  . Diabetes Neg Hx   . Coronary artery disease Neg Hx   . Stroke Neg Hx   . Hypertension Neg Hx   . Colon cancer Neg Hx   . Prostate cancer Neg Hx      Allergies as of 05/14/2019   No Known Allergies     Medication List       Accurate as of May 14, 2019 11:59 PM. If you have any questions, ask  your nurse or doctor.        STOP taking these medications   cetirizine 10 MG tablet Commonly known as: ZYRTEC Stopped by: Willow Ora, MD           Objective:   Physical Exam BP 115/71 (BP Location: Left Arm, Patient Position: Sitting, Cuff Size: Small)   Pulse 75   Temp (!) 97 F (36.1 C) (Temporal)   Resp 16   Ht 5\' 10"  (1.778 m)   Wt 177 lb 2 oz (80.3 kg)   SpO2 99%   BMI 25.41 kg/m   General: Well developed, NAD, BMI noted Neck: No  thyromegaly  HEENT:  Normocephalic . Face symmetric, atraumatic. Moderate amount of wax in both ears. Lungs:  CTA B Normal respiratory effort, no intercostal retractions, no accessory muscle use. Heart: RRR,  no murmur.  No pretibial edema bilaterally  Abdomen:  Not distended, soft, non-tender. No rebound or rigidity. GU: Normal scrotal contents, penis normal without rash, hypospadias or other abnormalities DRE: Nontender prostate but seems to be asymmetric, slightly more firm & slightly enlarged on the left   Skin: Exposed areas without rash. Not pale. Not jaundice Neurologic:  alert & oriented X3.  Speech normal, gait appropriate  for age and unassisted Strength symmetric and appropriate for age.  Psych: Cognition and judgment appear intact.  Cooperative with normal attention span and concentration.  Behavior appropriate. No anxious or depressed appearing.     Assessment      Assessment Back surgery 2012 Varicose vein surgery, R leg, 2013 L hand rash: saw derm, rx steroids, dx eczema ? , uses prn   PLAN: Here for CPX UTI: Recently diagnosed with UTI, will get records.  He is now asymptomatic.  DRE show a slightly asymptomatic prostate. Will check UA, urine culture and a PSA. Next steps: Depending on results we will get him back in 6 months for another DRE/labs versus urology referral versus antibiotics. Ear cerumen: Canals are somewhat narrow, unable to remove it with a spoon, recommend peroxide or Cerumenex and call if not better. RTC 1 year depending on results

## 2019-05-14 NOTE — Patient Instructions (Signed)
GO TO THE LAB : Get the blood work     GO TO THE FRONT DESK Schedule your next appointment   for a physical exam in 1 year Depending on results I might need to see you in 6 months

## 2019-05-14 NOTE — Assessment & Plan Note (Addendum)
-  Td 2017 -Flu shot today - CCS: never had a cscope.  Early CCS discussed, will start with a IFOB - Prostate exam: Done today, prostate asymmetry, see comments under UTI - Labs: CMP, FLP, CBC, TSH, PSA, UA, urine culture - Diet and exercise: Discussed

## 2019-05-14 NOTE — Telephone Encounter (Signed)
ROI faxed to [facility] at [phone]. ROI sent for scanning. Awaiting records.

## 2019-05-15 LAB — COMPREHENSIVE METABOLIC PANEL WITH GFR
ALT: 26 U/L (ref 0–53)
AST: 24 U/L (ref 0–37)
Albumin: 4.7 g/dL (ref 3.5–5.2)
Alkaline Phosphatase: 59 U/L (ref 39–117)
BUN: 16 mg/dL (ref 6–23)
CO2: 30 meq/L (ref 19–32)
Calcium: 9.2 mg/dL (ref 8.4–10.5)
Chloride: 102 meq/L (ref 96–112)
Creatinine, Ser: 1.03 mg/dL (ref 0.40–1.50)
GFR: 77.56 mL/min
Glucose, Bld: 80 mg/dL (ref 70–99)
Potassium: 4.4 meq/L (ref 3.5–5.1)
Sodium: 139 meq/L (ref 135–145)
Total Bilirubin: 0.9 mg/dL (ref 0.2–1.2)
Total Protein: 7.4 g/dL (ref 6.0–8.3)

## 2019-05-15 LAB — CBC WITH DIFFERENTIAL/PLATELET
Basophils Absolute: 0 10*3/uL (ref 0.0–0.1)
Basophils Relative: 0.8 % (ref 0.0–3.0)
Eosinophils Absolute: 0.1 10*3/uL (ref 0.0–0.7)
Eosinophils Relative: 2.4 % (ref 0.0–5.0)
HCT: 40.3 % (ref 39.0–52.0)
Hemoglobin: 13.7 g/dL (ref 13.0–17.0)
Lymphocytes Relative: 36.5 % (ref 12.0–46.0)
Lymphs Abs: 2.1 10*3/uL (ref 0.7–4.0)
MCHC: 33.9 g/dL (ref 30.0–36.0)
MCV: 88.8 fl (ref 78.0–100.0)
Monocytes Absolute: 0.4 10*3/uL (ref 0.1–1.0)
Monocytes Relative: 7.5 % (ref 3.0–12.0)
Neutro Abs: 3.1 10*3/uL (ref 1.4–7.7)
Neutrophils Relative %: 52.8 % (ref 43.0–77.0)
Platelets: 194 10*3/uL (ref 150.0–400.0)
RBC: 4.54 Mil/uL (ref 4.22–5.81)
RDW: 13.2 % (ref 11.5–15.5)
WBC: 5.8 10*3/uL (ref 4.0–10.5)

## 2019-05-15 LAB — LIPID PANEL
Cholesterol: 189 mg/dL (ref 0–200)
HDL: 62 mg/dL (ref >39.00)
LDL Cholesterol: 114 mg/dL — ABNORMAL HIGH (ref 0–99)
NonHDL: 126.77
Total CHOL/HDL Ratio: 3
Triglycerides: 62 mg/dL (ref 0.0–149.0)
VLDL: 12.4 mg/dL (ref 0.0–40.0)

## 2019-05-15 LAB — URINALYSIS, ROUTINE W REFLEX MICROSCOPIC
Bilirubin Urine: NEGATIVE
Hgb urine dipstick: NEGATIVE
Ketones, ur: NEGATIVE
Leukocytes,Ua: NEGATIVE
Nitrite: NEGATIVE
RBC / HPF: NONE SEEN
Specific Gravity, Urine: 1.02 (ref 1.000–1.030)
Total Protein, Urine: NEGATIVE
Urine Glucose: NEGATIVE
Urobilinogen, UA: 0.2 (ref 0.0–1.0)
pH: 6.5 (ref 5.0–8.0)

## 2019-05-15 LAB — TSH: TSH: 2.03 u[IU]/mL (ref 0.35–4.50)

## 2019-05-15 LAB — PSA: PSA: 8.67 ng/mL — ABNORMAL HIGH (ref 0.10–4.00)

## 2019-05-15 NOTE — Assessment & Plan Note (Signed)
Here for CPX UTI: Recently diagnosed with UTI, will get records.  He is now asymptomatic.  DRE show a slightly asymptomatic prostate. Will check UA, urine culture and a PSA. Next steps: Depending on results we will get him back in 6 months for another DRE/labs versus urology referral versus antibiotics. Ear cerumen: Canals are somewhat narrow, unable to remove it with a spoon, recommend peroxide or Cerumenex and call if not better. RTC 1 year depending on results

## 2019-05-16 LAB — URINE CULTURE
MICRO NUMBER:: 1084227
SPECIMEN QUALITY:: ADEQUATE

## 2019-05-16 MED ORDER — CIPROFLOXACIN HCL 500 MG PO TABS: 500.0000 mg | ORAL_TABLET | Freq: Two times a day (BID) | ORAL | 0 refills | Status: DC

## 2019-05-16 NOTE — Addendum Note (Signed)
Addended byDamita Dunnings D on: 05/16/2019 03:01 PM   Modules accepted: Orders

## 2019-05-28 NOTE — Telephone Encounter (Signed)
Faxed ROI request again today.

## 2019-06-25 NOTE — Telephone Encounter (Signed)
Did you ever received these records while I was out?

## 2019-07-03 NOTE — Telephone Encounter (Signed)
Have not seen the records yet

## 2019-07-11 ENCOUNTER — Other Ambulatory Visit (INDEPENDENT_AMBULATORY_CARE_PROVIDER_SITE_OTHER): Payer: BC Managed Care – PPO

## 2019-07-11 DIAGNOSIS — Z0000 Encounter for general adult medical examination without abnormal findings: Secondary | ICD-10-CM

## 2019-07-11 LAB — FECAL OCCULT BLOOD, IMMUNOCHEMICAL: Fecal Occult Bld: NEGATIVE

## 2019-08-16 ENCOUNTER — Telehealth: Payer: Self-pay

## 2019-08-16 DIAGNOSIS — H938X1 Other specified disorders of right ear: Secondary | ICD-10-CM

## 2019-08-16 NOTE — Telephone Encounter (Signed)
Spoke w/ Pt- having issues w/ fullness/stuffiness in R ear- ENT referral placed.

## 2019-08-16 NOTE — Telephone Encounter (Signed)
Patient called in to see if Dr. Drue Novel can send in a referral  For an  Ear Nose and throat  Specialist.   Please follow up with the patient at [phone]   Thanks,

## 2019-08-20 ENCOUNTER — Encounter: Payer: Self-pay | Admitting: Internal Medicine

## 2019-08-20 ENCOUNTER — Ambulatory Visit (INDEPENDENT_AMBULATORY_CARE_PROVIDER_SITE_OTHER): Payer: BC Managed Care – PPO | Admitting: Internal Medicine

## 2019-08-20 ENCOUNTER — Other Ambulatory Visit: Payer: Self-pay

## 2019-08-20 VITALS — BP 118/78 | HR 85 | Temp 97.1°F | Resp 18 | Ht 70.0 in | Wt 186.5 lb

## 2019-08-20 DIAGNOSIS — N429 Disorder of prostate, unspecified: Secondary | ICD-10-CM | POA: Diagnosis not present

## 2019-08-20 DIAGNOSIS — R972 Elevated prostate specific antigen [PSA]: Secondary | ICD-10-CM

## 2019-08-20 NOTE — Progress Notes (Signed)
Pre visit review using our clinic review tool, if applicable. No additional management support is needed unless otherwise documented below in the visit note. 

## 2019-08-20 NOTE — Patient Instructions (Signed)
  GO TO THE FRONT DESK Please come back for blood work  We are referring you to the urologist due to prostate being enlarged on the left

## 2019-08-20 NOTE — Progress Notes (Signed)
   Subjective:    Patient ID: Jeffery Rose, male    DOB: January 19, 1971, 49 y.o.   MRN: [id number]  DOS:  08/20/2019 Type of visit - description: f/u See last visit, was seen a UC for a UTI by late 03/2019, we have been able to get records Then, was seen here, PSA was elevated, s/p cipro x 2 weeks, feels well     Review of Systems Denies fever chills No dysuria, gross hematuria difficulty urinating No nausea vomiting or diarrhea  Past Medical History:  Diagnosis Date  . Varicose veins    R leg     Past Surgical History:  Procedure Laterality Date  . BACK SURGERY     2012, Dr Danielle Dess  . VARICOSE VEIN SURGERY     R, 2013    Allergies as of 08/20/2019   No Known Allergies     Medication List       Accurate as of August 20, 2019  4:15 PM. If you have any questions, ask your nurse or doctor.        ciprofloxacin 500 MG tablet Commonly known as: Cipro Take 1 tablet (500 mg total) by mouth 2 (two) times daily.             Objective:   Physical Exam BP 118/78 (BP Location: Right Arm, Patient Position: Sitting, Cuff Size: Small)   Pulse 85   Temp (!) 97.1 F (36.2 C) (Temporal)   Resp 18   Ht 5\' 10"  (1.778 m)   Wt 186 lb 8 oz (84.6 kg)   SpO2 98%   BMI 26.76 kg/m  General:   Well developed, NAD, BMI noted.  HEENT:  Normocephalic . Face symmetric, atraumatic Abdomen:  Not distended, soft, non-tender. No rebound or rigidity.   Skin: Not pale. Not jaundice Lower extremities: no pretibial edema bilaterally  DRE: Normal sphincter tone, prostate is a slightly more firm and enlarged on the left, not tender, not nodular.  Right side seems normal. Neurologic:  alert & oriented X3.  Speech normal, gait appropriate for age and unassisted Psych--  Cognition and judgment appear intact.  Cooperative with normal attention span and concentration.  Behavior appropriate. No anxious or depressed appearing.     Assessment    ASSESSMENT Back surgery 2012 Varicose  vein surgery, R leg, 2013 L hand rash: saw derm, rx steroids, dx eczema ? , uses prn   PLAN: Increased PSA, UTI diagnosed elsewhere 9-20 20,  abnormal DRE: Since the last visit, we have not been able to get the records from previous UTI, PSA was elevated, got Cipro x2 weeks, here today for follow-up, essentially asymptomatic, DRE continue to be abnormal. Plan: Check PSA, refer to urology.  This was discussed with the patient, DDx includes early cancer.  He knows to call anytime if he has questions.   This visit occurred during the SARS-CoV-2 public health emergency.  Safety protocols were in place, including screening questions prior to the visit, additional usage of staff PPE, and extensive cleaning of exam room while observing appropriate contact time as indicated for disinfecting solutions.

## 2019-08-21 ENCOUNTER — Other Ambulatory Visit (INDEPENDENT_AMBULATORY_CARE_PROVIDER_SITE_OTHER): Payer: BC Managed Care – PPO

## 2019-08-21 DIAGNOSIS — N429 Disorder of prostate, unspecified: Secondary | ICD-10-CM

## 2019-08-21 DIAGNOSIS — R972 Elevated prostate specific antigen [PSA]: Secondary | ICD-10-CM | POA: Diagnosis not present

## 2019-08-21 LAB — PSA: PSA: 4.79 ng/mL — ABNORMAL HIGH (ref 0.10–4.00)

## 2019-08-22 NOTE — Assessment & Plan Note (Signed)
Increased PSA, UTI diagnosed elsewhere 9-20 20,  abnormal DRE: Since the last visit, we have not been able to get the records from previous UTI, PSA was elevated, got Cipro x2 weeks, here today for follow-up, essentially asymptomatic, DRE continue to be abnormal. Plan: Check PSA, refer to urology.  This was discussed with the patient, DDx includes early cancer.  He knows to call anytime if he has questions.

## 2019-09-03 DIAGNOSIS — H6123 Impacted cerumen, bilateral: Secondary | ICD-10-CM | POA: Diagnosis not present

## 2019-09-03 DIAGNOSIS — J343 Hypertrophy of nasal turbinates: Secondary | ICD-10-CM | POA: Diagnosis not present

## 2019-09-03 DIAGNOSIS — H902 Conductive hearing loss, unspecified: Secondary | ICD-10-CM | POA: Insufficient documentation

## 2019-09-06 ENCOUNTER — Encounter: Payer: Self-pay | Admitting: Internal Medicine

## 2019-09-06 ENCOUNTER — Telehealth: Payer: Self-pay

## 2019-09-06 DIAGNOSIS — R972 Elevated prostate specific antigen [PSA]: Secondary | ICD-10-CM

## 2019-09-06 NOTE — Telephone Encounter (Signed)
I called [facility] to check on referral. They are short staffed and have not contacted pt to schedule but they will reach out to him today.

## 2019-09-06 NOTE — Telephone Encounter (Signed)
Can you check on status of urology referral please?

## 2019-09-06 NOTE — Telephone Encounter (Signed)
Patient called in to see if Dr. Drue Novel to put in a referral to a  Urologist. The patient would like a call back because he has some questions . Please call him at [phone] Thanks,

## 2019-11-05 DIAGNOSIS — N400 Enlarged prostate without lower urinary tract symptoms: Secondary | ICD-10-CM | POA: Diagnosis not present

## 2019-11-05 DIAGNOSIS — R972 Elevated prostate specific antigen [PSA]: Secondary | ICD-10-CM | POA: Diagnosis not present

## 2019-11-05 DIAGNOSIS — N401 Enlarged prostate with lower urinary tract symptoms: Secondary | ICD-10-CM | POA: Diagnosis not present

## 2019-11-05 DIAGNOSIS — N138 Other obstructive and reflux uropathy: Secondary | ICD-10-CM | POA: Diagnosis not present

## 2020-05-15 ENCOUNTER — Encounter: Payer: BC Managed Care – PPO | Admitting: Internal Medicine

## 2020-06-08 ENCOUNTER — Other Ambulatory Visit: Payer: Self-pay

## 2020-06-08 ENCOUNTER — Other Ambulatory Visit (HOSPITAL_BASED_OUTPATIENT_CLINIC_OR_DEPARTMENT_OTHER): Payer: Self-pay | Admitting: Internal Medicine

## 2020-06-08 ENCOUNTER — Ambulatory Visit: Payer: BC Managed Care – PPO | Attending: Internal Medicine

## 2020-06-08 ENCOUNTER — Ambulatory Visit (INDEPENDENT_AMBULATORY_CARE_PROVIDER_SITE_OTHER): Payer: BC Managed Care – PPO | Admitting: Internal Medicine

## 2020-06-08 ENCOUNTER — Encounter: Payer: Self-pay | Admitting: Internal Medicine

## 2020-06-08 VITALS — BP 117/77 | HR 76 | Temp 97.7°F | Ht 70.0 in | Wt 189.0 lb

## 2020-06-08 DIAGNOSIS — Z0000 Encounter for general adult medical examination without abnormal findings: Secondary | ICD-10-CM | POA: Diagnosis not present

## 2020-06-08 DIAGNOSIS — N401 Enlarged prostate with lower urinary tract symptoms: Secondary | ICD-10-CM | POA: Diagnosis not present

## 2020-06-08 DIAGNOSIS — Z23 Encounter for immunization: Secondary | ICD-10-CM

## 2020-06-08 NOTE — Patient Instructions (Addendum)
See the urologist by 11/2020   GO TO THE LAB : Get the blood work     GO TO THE FRONT DESK, PLEASE SCHEDULE YOUR APPOINTMENTS Come back for a physical exam in 1 year

## 2020-06-08 NOTE — Progress Notes (Signed)
   Subjective:    Patient ID: Jeffery Rose, male    DOB: [birth year], 49 y.o.   MRN: [id number]  DOS:  06/08/2020 Type of visit - description: CPX Since the last office visit is doing well. He went to see urology due to increased PSA, note reviewed. Current symptoms are occasional difficulty urinating which is rare.  No urinary frequency or blood in the urine.   Review of Systems  Other than above, a 14 point review of systems is negative      Past Medical History:  Diagnosis Date  . Varicose veins    R leg     Past Surgical History:  Procedure Laterality Date  . BACK SURGERY     2012, Dr Danielle Dess  . VARICOSE VEIN SURGERY     R, 2013    Allergies as of 06/08/2020   No Known Allergies     Medication List       Accurate as of June 08, 2020 11:59 PM. If you have any questions, ask your nurse or doctor.        STOP taking these medications   ciprofloxacin 500 MG tablet Commonly known as: Cipro Stopped by: Willow Ora, MD          Objective:   Physical Exam BP 117/77 (BP Location: Right Arm, Patient Position: Sitting, Cuff Size: Large)   Pulse 76   Temp 97.7 F (36.5 C) (Oral)   Ht 5\' 10"  (1.778 m)   Wt 189 lb (85.7 kg)   SpO2 98%   BMI 27.12 kg/m  General: Well developed, NAD, BMI noted Neck: No  thyromegaly  HEENT:  Normocephalic . Face symmetric, atraumatic Lungs:  CTA B Normal respiratory effort, no intercostal retractions, no accessory muscle use. Heart: RRR,  no murmur.  Abdomen:  Not distended, soft, non-tender. No rebound or rigidity.   Lower extremities: no pretibial edema bilaterally  Skin: Exposed areas without rash. Not pale. Not jaundice Neurologic:  alert & oriented X3.  Speech normal, gait appropriate for age and unassisted Strength symmetric and appropriate for age.  Psych: Cognition and judgment appear intact.  Cooperative with normal attention span and concentration.  Behavior appropriate. No anxious or depressed  appearing.     Assessment      ASSESSMENT Back surgery 2012 Varicose vein surgery, R leg, 2013 L hand rash: saw derm, rx steroids, dx eczema ? , uses prn   PLAN: BPH: See comments under physical exam RTC 1 year.   This visit occurred during the SARS-CoV-2 public health emergency.  Safety protocols were in place, including screening questions prior to the visit, additional usage of staff PPE, and extensive cleaning of exam room while observing appropriate contact time as indicated for disinfecting solutions.

## 2020-06-09 ENCOUNTER — Encounter: Payer: Self-pay | Admitting: Internal Medicine

## 2020-06-09 LAB — COMPREHENSIVE METABOLIC PANEL
ALT: 43 U/L (ref 0–53)
AST: 31 U/L (ref 0–37)
Albumin: 4.6 g/dL (ref 3.5–5.2)
Alkaline Phosphatase: 58 U/L (ref 39–117)
BUN: 17 mg/dL (ref 6–23)
CO2: 31 mEq/L (ref 19–32)
Calcium: 9.3 mg/dL (ref 8.4–10.5)
Chloride: 103 mEq/L (ref 96–112)
Creatinine, Ser: 1.31 mg/dL (ref 0.40–1.50)
GFR: 64.77 mL/min (ref >60.00)
Glucose, Bld: 67 mg/dL — ABNORMAL LOW (ref 70–99)
Potassium: 4.4 mEq/L (ref 3.5–5.1)
Sodium: 141 mEq/L (ref 135–145)
Total Bilirubin: 0.5 mg/dL (ref 0.2–1.2)
Total Protein: 7.2 g/dL (ref 6.0–8.3)

## 2020-06-09 LAB — URINALYSIS, ROUTINE W REFLEX MICROSCOPIC
Bilirubin Urine: NEGATIVE
Hgb urine dipstick: NEGATIVE
Ketones, ur: NEGATIVE
Leukocytes,Ua: NEGATIVE
Nitrite: NEGATIVE
RBC / HPF: NONE SEEN (ref 0–?)
Specific Gravity, Urine: 1.025 (ref 1.000–1.030)
Total Protein, Urine: NEGATIVE
Urine Glucose: NEGATIVE
Urobilinogen, UA: 0.2 (ref 0.0–1.0)
WBC, UA: NONE SEEN (ref 0–?)
pH: 6 (ref 5.0–8.0)

## 2020-06-09 LAB — URINE CULTURE
MICRO NUMBER:: 11280918
SPECIMEN QUALITY:: ADEQUATE

## 2020-06-09 LAB — CBC WITH DIFFERENTIAL/PLATELET
Basophils Absolute: 0 10*3/uL (ref 0.0–0.1)
Basophils Relative: 0.7 % (ref 0.0–3.0)
Eosinophils Absolute: 0.1 10*3/uL (ref 0.0–0.7)
Eosinophils Relative: 2.1 % (ref 0.0–5.0)
HCT: 42.4 % (ref 39.0–52.0)
Hemoglobin: 14.2 g/dL (ref 13.0–17.0)
Lymphocytes Relative: 39.9 % (ref 12.0–46.0)
Lymphs Abs: 2 10*3/uL (ref 0.7–4.0)
MCHC: 33.6 g/dL (ref 30.0–36.0)
MCV: 89.3 fl (ref 78.0–100.0)
Monocytes Absolute: 0.3 10*3/uL (ref 0.1–1.0)
Monocytes Relative: 6.5 % (ref 3.0–12.0)
Neutro Abs: 2.5 10*3/uL (ref 1.4–7.7)
Neutrophils Relative %: 50.8 % (ref 43.0–77.0)
Platelets: 205 10*3/uL (ref 150.0–400.0)
RBC: 4.75 Mil/uL (ref 4.22–5.81)
RDW: 13 % (ref 11.5–15.5)
WBC: 5 10*3/uL (ref 4.0–10.5)

## 2020-06-09 LAB — LIPID PANEL
Cholesterol: 167 mg/dL (ref 0–200)
HDL: 53.6 mg/dL (ref >39.00)
LDL Cholesterol: 101 mg/dL — ABNORMAL HIGH (ref 0–99)
NonHDL: 113.04
Total CHOL/HDL Ratio: 3
Triglycerides: 62 mg/dL (ref 0.0–149.0)
VLDL: 12.4 mg/dL (ref 0.0–40.0)

## 2020-06-09 LAB — PSA: PSA: 1.4 ng/mL (ref 0.10–4.00)

## 2020-06-09 NOTE — Assessment & Plan Note (Signed)
-  Td 2017 -COVID vaccines x2, plans to do a booster -Flu shot today - CCS: never had a cscope.  (-) IFOB  07/2019 - Prostate exam: Saw urology 11/05/2019, they noted a asymmetric prostate, no intervention was felt to be needed, Rx Rapaflo and RTC in 1 year. Rapaflo caused dry ejaculation, patient discontinued now symptoms are mild. We will check a PSA, UA urine culture.  Rec f/u  with urology 11/2020. - Labs, had a very light meal: CMP, lipid panel, CBC, PSA.  UA, urine culture - Diet and exercise: Discussed

## 2020-06-09 NOTE — Assessment & Plan Note (Signed)
BPH: See comments under physical exam RTC 1 year.

## 2020-06-16 MED FILL — PFIZER-BIONTECH COVID-19 VA: 30 | 1 days supply | Qty: 0 | Fill #0

## 2020-09-28 ENCOUNTER — Telehealth: Payer: Self-pay | Admitting: *Deleted

## 2020-09-28 NOTE — Telephone Encounter (Signed)
Caller Name [facility] Caller Phone Number [phone] Patient Name Jeffery Rose Patient DOB 1971/02/02 Call Type Message Only Information Provided Reason for Call Request to Schedule Office Appointment Initial Comment Caller states he wants to schedule appt for today. Additional Comment provided hrs/declined triage. Disp. Time Disposition Final User 09/28/2020 7:09:00 AM General Information Provided Yes Pearletha Furl

## 2020-09-28 NOTE — Telephone Encounter (Signed)
Left message on machine to call back  

## 2020-09-29 ENCOUNTER — Ambulatory Visit (INDEPENDENT_AMBULATORY_CARE_PROVIDER_SITE_OTHER): Payer: BC Managed Care – PPO | Admitting: Internal Medicine

## 2020-09-29 ENCOUNTER — Other Ambulatory Visit: Payer: Self-pay

## 2020-09-29 VITALS — BP 132/72 | HR 97 | Temp 97.4°F | Ht 70.0 in | Wt 191.8 lb

## 2020-09-29 DIAGNOSIS — J029 Acute pharyngitis, unspecified: Secondary | ICD-10-CM | POA: Diagnosis not present

## 2020-09-29 MED ORDER — AMOXICILLIN 875 MG PO TABS: 875.0000 mg | ORAL_TABLET | Freq: Two times a day (BID) | ORAL | 0 refills | Status: DC

## 2020-09-29 NOTE — Telephone Encounter (Signed)
Voicemail left yesterday, 09/28/20 informing Pt to call and schedule visit.

## 2020-09-29 NOTE — Patient Instructions (Signed)
Take penicillin as recommended  Drink plenty of fluids  Tylenol ibuprofen if needed  We are doing a culture, if it comes back negative we could stop penicillin.  Call if not gradually better  Call if severe symptoms

## 2020-09-29 NOTE — Telephone Encounter (Signed)
Nurse Assessment Nurse: Melanee Spry, RN, Beth Date/Time (Eastern Time): 09/28/2020 9:19:02 PM Confirm and document reason for call. If symptomatic, describe symptoms. ---Caller says he has sore throat with a dry cough. Started friday evening. Denies fever. Does the patient have any new or worsening symptoms? ---Yes Will a triage be completed? ---Yes Related visit to physician within the last 2 weeks? ---No Does the PT have any chronic conditions? (i.e. diabetes, asthma, this includes High risk factors for pregnancy, etc.) ---No Is this a behavioral health or substance abuse call? ---No Guidelines Guideline Title Affirmed Question Affirmed Notes Nurse Date/Time (Eastern Time) Sore Throat [1] Sore throat is the only symptom AND [2] present > 48 hours Newhart, RN, [facility] 09/28/2020 9:20:51 PM Disp. Time Lamount Cohen Time) Disposition Final User 09/28/2020 9:24:43 PM SEE PCP WITHIN 3 DAYS Yes Newhart, RN, Beth Caller Disagree/Comply Comply Caller Understands Yes PreDisposition Did not know what to do Care Advice Given Per Guideline PLEASE NOTE: All timestamps contained within this report are represented as [place] Standard Time. CONFIDENTIALTY NOTICE: This fax transmission is intended only for the addressee. It contains information that is legally privileged, confidential or otherwise protected from use or disclosure. If you are not the intended recipient, you are strictly prohibited from reviewing, disclosing, copying using or disseminating any of this information or taking any action in reliance on or regarding this information. If you have received this fax in error, please notify us immediately by telephone so that we can arrange for its return to [place]. Phone: [phone], Toll-Free: [phone], Fax: [phone] Page: 2 of 2 Call Id: [id number] Care Advice Given Per Guideline SEE PCP WITHIN 3 DAYS: * You need to be seen within 2 or 3 days. * PCP VISIT: Call your doctor (or NP/PA) during  regular office hours and make an appointment. A clinic or urgent care center are good places to go for care if your doctor's office is closed or you can't get an appointment. NOTE: If office will be open tomorrow, tell caller to call then, not in 3 days. SORE THROAT: * Here are some simple things you can do to treat and reduce sore throat pain. * Sip warm chicken broth or apple juice. * Gargle with warm salt water four times a day. To make salt water, put 1/2 teaspoon of salt in 8 oz (240 ml) of warm water. PAIN AND FEVER MEDICINES: * ACETAMINOPHEN REGULAR STRENGTH TYLENOL: Take 650 mg (two 325 mg pills) by mouth every 4-6 hours as needed. Each Regular Strength Tylenol pill has 325 mg of acetaminophen. The most you should take each day is 3,250 mg (10 pills a day). * IBUPROFEN (E.G., MOTRIN, ADVIL): Take 400 mg (two 200 mg pills) by mouth every 6 hours. The most you should take each day is 1,200 mg (six 200 mg pills), unless your doctor has told you to take more. SOFT DIET: * Eat a soft diet. * Cold drinks, popsicles, and milk shakes are especially good. Avoid citrus fruits. DRINK PLENTY OF LIQUIDS: * Drink plenty of liquids. It is important to stay well-hydrated. CALL BACK IF: * You become worse CARE ADVICE given per Sore Throat (Adult) guideline. Comments User: June Leap, RN Date/Time Lamount Cohen Time): 09/28/2020 9:25:53 PM Home Covid test was negative Referrals REFERRED TO PCP OFFICE

## 2020-09-29 NOTE — Progress Notes (Signed)
   Subjective:    Patient ID: Jeffery Rose, male    DOB: 30-Nov-1970, 50 y.o.   MRN: [id number]  DOS:  09/29/2020 Type of visit - description: Acute Symptoms started 4 days ago: Sore throat, fever up to 101.0, dry cough, some malaise. Did not have any sinus congestion. Overall today he feels better.  His daughter had similar symptoms but they lasted 2 days. Had a negative Covid test 3 days ago.  Denies sinus congestion or other than myalgias. No chest pain no difficulty breathing No nausea vomiting No myalgias    Review of Systems See above   Past Medical History:  Diagnosis Date  . Varicose veins    R leg     Past Surgical History:  Procedure Laterality Date  . BACK SURGERY     2012, Dr Danielle Dess  . VARICOSE VEIN SURGERY     R, 2013    Allergies as of 09/29/2020   No Known Allergies     Medication List       Accurate as of September 29, 2020 11:59 PM. If you have any questions, ask your nurse or doctor.        amoxicillin 875 MG tablet Commonly known as: AMOXIL Take 1 tablet (875 mg total) by mouth 2 (two) times daily. Started by: Willow Ora, MD          Objective:   Physical Exam BP 132/72 (BP Location: Right Arm, Patient Position: Sitting, Cuff Size: Large)   Pulse 97   Temp (!) 97.4 F (36.3 C) (Temporal)   Ht 5\' 10"  (1.778 m)   Wt 191 lb 12.8 oz (87 kg)   SpO2 97%   BMI 27.52 kg/m  General:   Well developed, NAD, BMI noted. HEENT:  Normocephalic . Face symmetric, atraumatic TMs: Unable to visualize due to wax Throat: + Erythema, no white patches, uvula midline. Nose is slightly congested Lungs:  CTA B Normal respiratory effort, no intercostal retractions, no accessory muscle use. Heart: RRR,  no murmur.  Lower extremities: no pretibial edema bilaterally  Skin: Not pale. Not jaundice Neurologic:  alert & oriented X3.  Speech normal, gait appropriate for age and unassisted Psych--  Cognition and judgment appear intact.  Cooperative with  normal attention span and concentration.  Behavior appropriate. No anxious or depressed appearing.      Assessment     ASSESSMENT Back surgery 2012 Varicose vein surgery, R leg, 2013 L hand rash: saw derm, rx steroids, dx eczema ? , uses prn   PLAN: Pharyngitis: Patient is triple vaccinated against COVID, had a home  Covid test (-) 3 days ago. Syndrome consistent with pharyngitis, we are out of rapid strep test, will start amoxicillin.  Check a throat culture, d/c antibiotics if culture negative. See AVS.  This visit occurred during the SARS-CoV-2 public health emergency.  Safety protocols were in place, including screening questions prior to the visit, additional usage of staff PPE, and extensive cleaning of exam room while observing appropriate contact time as indicated for disinfecting solutions.

## 2020-09-30 NOTE — Assessment & Plan Note (Signed)
Pharyngitis: Patient is triple vaccinated against COVID, had a home  Covid test (-) 3 days ago. Syndrome consistent with pharyngitis, we are out of rapid strep test, will start amoxicillin.  Check a throat culture, d/c antibiotics if culture negative. See AVS.

## 2020-10-01 LAB — CULTURE, GROUP A STREP
MICRO NUMBER:: 11705489
SPECIMEN QUALITY:: ADEQUATE

## 2020-10-02 ENCOUNTER — Encounter: Payer: Self-pay | Admitting: Internal Medicine

## 2020-10-02 ENCOUNTER — Other Ambulatory Visit: Payer: Self-pay | Admitting: Internal Medicine

## 2020-10-02 MED ORDER — HYDROCODONE-HOMATROPINE 5-1.5 MG/5ML PO SYRP
5.0000 mL | ORAL_SOLUTION | Freq: Every evening | ORAL | 0 refills | Status: DC | PRN
Start: 2020-10-02 — End: 2021-06-09

## 2020-12-10 DIAGNOSIS — N401 Enlarged prostate with lower urinary tract symptoms: Secondary | ICD-10-CM | POA: Insufficient documentation

## 2020-12-10 DIAGNOSIS — N400 Enlarged prostate without lower urinary tract symptoms: Secondary | ICD-10-CM | POA: Diagnosis not present

## 2020-12-10 DIAGNOSIS — N138 Other obstructive and reflux uropathy: Secondary | ICD-10-CM | POA: Insufficient documentation

## 2020-12-10 DIAGNOSIS — R972 Elevated prostate specific antigen [PSA]: Secondary | ICD-10-CM | POA: Diagnosis not present

## 2021-06-09 ENCOUNTER — Encounter: Payer: Self-pay | Admitting: Internal Medicine

## 2021-06-09 ENCOUNTER — Ambulatory Visit (INDEPENDENT_AMBULATORY_CARE_PROVIDER_SITE_OTHER): Payer: BC Managed Care – PPO | Admitting: Internal Medicine

## 2021-06-09 VITALS — BP 122/68 | HR 89 | Temp 98.2°F | Resp 16 | Ht 70.0 in | Wt 187.5 lb

## 2021-06-09 DIAGNOSIS — Z1159 Encounter for screening for other viral diseases: Secondary | ICD-10-CM | POA: Diagnosis not present

## 2021-06-09 DIAGNOSIS — L309 Dermatitis, unspecified: Secondary | ICD-10-CM | POA: Insufficient documentation

## 2021-06-09 DIAGNOSIS — Z114 Encounter for screening for human immunodeficiency virus [HIV]: Secondary | ICD-10-CM

## 2021-06-09 DIAGNOSIS — Z0000 Encounter for general adult medical examination without abnormal findings: Secondary | ICD-10-CM

## 2021-06-09 DIAGNOSIS — Z23 Encounter for immunization: Secondary | ICD-10-CM | POA: Diagnosis not present

## 2021-06-09 NOTE — Progress Notes (Signed)
Subjective:    Patient ID: Jeffery Rose, male    DOB: [birth year], 50 y.o.   MRN: [id number]  DOS:  06/09/2021 Type of visit - description: CPX  Since the last office visit is doing well and has no major concerns. Sees urology regularly. Denies any GI symptoms.   Review of Systems  Other than above, a 14 point review of systems is negative     Past Medical History:  Diagnosis Date   Varicose veins    R leg     Past Surgical History:  Procedure Laterality Date   BACK SURGERY     2012, Dr Danielle Dess   VARICOSE VEIN SURGERY     R, 2013    Social History   Socioeconomic History   Marital status: Married    Spouse name: Not on file   Number of children: 2   Years of education: Not on file   Highest education level: Not on file  Occupational History   Occupation: Sports administrator     Employer: [employer]  Tobacco Use   Smoking status: Former   Smokeless tobacco: Never  Substance and Sexual Activity   Alcohol use: Yes    Comment: couple beers weekly   Drug use: No   Sexual activity: Not on file  Other Topics Concern   Not on file  Social History Narrative   Original from [place], parents still there   2 children    1st child is a boy autistic born 2010   Daughter born 03-2018          Social Determinants of Health   Financial Resource Strain: Not on file  Food Insecurity: Not on file  Transportation Needs: Not on file  Physical Activity: Not on file  Stress: Not on file  Social Connections: Not on file  Intimate Partner Violence: Not on file     Allergies as of 06/09/2021   No Known Allergies      Medication List        Accurate as of June 09, 2021 11:59 PM. If you have any questions, ask your nurse or doctor.          STOP taking these medications    HYDROcodone-homatropine 5-1.5 MG/5ML syrup Commonly known as: HYCODAN Stopped by: Willow Ora, MD       TAKE these medications    alfuzosin 10 MG 24 hr tablet Commonly known as:  UROXATRAL Take 1 tablet by mouth daily.           Objective:   Physical Exam BP 122/68 (BP Location: Left Arm, Patient Position: Sitting, Cuff Size: Small)   Pulse 89   Temp 98.2 F (36.8 C) (Oral)   Resp 16   Ht 5\' 10"  (1.778 m)   Wt 187 lb 8 oz (85 kg)   SpO2 97%   BMI 26.90 kg/m  General: Well developed, NAD, BMI noted Neck: No  thyromegaly  HEENT:  Normocephalic . Face symmetric, atraumatic Lungs:  CTA B Normal respiratory effort, no intercostal retractions, no accessory muscle use. Heart: RRR,  no murmur.  Abdomen:  Not distended, soft, non-tender. No rebound or rigidity.   Lower extremities: no pretibial edema bilaterally  Skin: Exposed areas without rash. Not pale. Not jaundice Neurologic:  alert & oriented X3.  Speech normal, gait appropriate for age and unassisted Strength symmetric and appropriate for age.  Psych: Cognition and judgment appear intact.  Cooperative with normal attention span and concentration.  Behavior appropriate. No anxious or depressed  appearing.     Assessment      ASSESSMENT Back surgery 2012 Varicose vein surgery, R leg, 2013 L hand rash: saw derm, rx steroids, dx eczema ? , uses prn  Increase PSA  PLAN: Here for CPX Feeling well. RTC 1 year    This visit occurred during the SARS-CoV-2 public health emergency.  Safety protocols were in place, including screening questions prior to the visit, additional usage of staff PPE, and extensive cleaning of exam room while observing appropriate contact time as indicated for disinfecting solutions.

## 2021-06-09 NOTE — Patient Instructions (Signed)
   GO TO THE LAB : Get the blood work     GO TO THE FRONT DESK, PLEASE SCHEDULE YOUR APPOINTMENTS Come back for a physical exam in 1 year 

## 2021-06-10 ENCOUNTER — Encounter: Payer: Self-pay | Admitting: Internal Medicine

## 2021-06-10 LAB — LIPID PANEL
Cholesterol: 156 mg/dL (ref 0–200)
HDL: 58.3 mg/dL (ref >39.00)
LDL Cholesterol: 82 mg/dL (ref 0–99)
NonHDL: 97.9
Total CHOL/HDL Ratio: 3
Triglycerides: 81 mg/dL (ref 0.0–149.0)
VLDL: 16.2 mg/dL (ref 0.0–40.0)

## 2021-06-10 LAB — COMPREHENSIVE METABOLIC PANEL
ALT: 22 U/L (ref 0–53)
AST: 19 U/L (ref 0–37)
Albumin: 4.5 g/dL (ref 3.5–5.2)
Alkaline Phosphatase: 54 U/L (ref 39–117)
BUN: 15 mg/dL (ref 6–23)
CO2: 31 mEq/L (ref 19–32)
Calcium: 9.5 mg/dL (ref 8.4–10.5)
Chloride: 104 mEq/L (ref 96–112)
Creatinine, Ser: 0.94 mg/dL (ref 0.40–1.50)
GFR: 95.78 mL/min (ref >60.00)
Glucose, Bld: 100 mg/dL — ABNORMAL HIGH (ref 70–99)
Potassium: 4.1 mEq/L (ref 3.5–5.1)
Sodium: 141 mEq/L (ref 135–145)
Total Bilirubin: 0.9 mg/dL (ref 0.2–1.2)
Total Protein: 7 g/dL (ref 6.0–8.3)

## 2021-06-10 LAB — CBC WITH DIFFERENTIAL/PLATELET
Basophils Absolute: 0 10*3/uL (ref 0.0–0.1)
Basophils Relative: 0.6 % (ref 0.0–3.0)
Eosinophils Absolute: 0.1 10*3/uL (ref 0.0–0.7)
Eosinophils Relative: 1.6 % (ref 0.0–5.0)
HCT: 38.4 % — ABNORMAL LOW (ref 39.0–52.0)
Hemoglobin: 13 g/dL (ref 13.0–17.0)
Lymphocytes Relative: 36.3 % (ref 12.0–46.0)
Lymphs Abs: 1.8 10*3/uL (ref 0.7–4.0)
MCHC: 33.9 g/dL (ref 30.0–36.0)
MCV: 88.4 fl (ref 78.0–100.0)
Monocytes Absolute: 0.3 10*3/uL (ref 0.1–1.0)
Monocytes Relative: 6.3 % (ref 3.0–12.0)
Neutro Abs: 2.8 10*3/uL (ref 1.4–7.7)
Neutrophils Relative %: 55.2 % (ref 43.0–77.0)
Platelets: 205 10*3/uL (ref 150.0–400.0)
RBC: 4.34 Mil/uL (ref 4.22–5.81)
RDW: 12.9 % (ref 11.5–15.5)
WBC: 5.1 10*3/uL (ref 4.0–10.5)

## 2021-06-10 LAB — HEPATITIS C ANTIBODY
Hepatitis C Ab: NONREACTIVE
SIGNAL TO CUT-OFF: 0.05 (ref <1.00)

## 2021-06-10 LAB — HIV ANTIBODY (ROUTINE TESTING W REFLEX): HIV 1&2 Ab, 4th Generation: NONREACTIVE

## 2021-06-10 NOTE — Assessment & Plan Note (Signed)
Here for CPX Feeling well. RTC 1 year 

## 2021-06-10 NOTE — Assessment & Plan Note (Signed)
-  Td 2017 -COVID vax booster recommended.  He is hesitant.  Benefits discussed. -Flu shot today - CCS: never had a cscope.  (-) IFOB  07/2019, 3 options discussed, we agreed on repeat an IFOB -Prostate cancer screening: Sees urology regularly for elevated PSA, barely symptomatic, takes medications as needed, LOV with urology 12/10/2020, next 1 year. - LabsCMP, FLP, CBC, hep C, HIV, Hemoccult - Diet and exercise: Discussed

## 2021-11-12 DIAGNOSIS — H6123 Impacted cerumen, bilateral: Secondary | ICD-10-CM | POA: Diagnosis not present

## 2021-12-09 DIAGNOSIS — N400 Enlarged prostate without lower urinary tract symptoms: Secondary | ICD-10-CM | POA: Diagnosis not present

## 2021-12-09 DIAGNOSIS — R972 Elevated prostate specific antigen [PSA]: Secondary | ICD-10-CM | POA: Diagnosis not present

## 2021-12-23 DIAGNOSIS — H52203 Unspecified astigmatism, bilateral: Secondary | ICD-10-CM | POA: Diagnosis not present

## 2021-12-23 DIAGNOSIS — H532 Diplopia: Secondary | ICD-10-CM | POA: Diagnosis not present

## 2021-12-31 DIAGNOSIS — H532 Diplopia: Secondary | ICD-10-CM | POA: Diagnosis not present

## 2022-06-10 ENCOUNTER — Encounter: Payer: BC Managed Care – PPO | Admitting: Internal Medicine

## 2022-07-01 ENCOUNTER — Encounter: Payer: Self-pay | Admitting: Internal Medicine

## 2022-07-01 ENCOUNTER — Ambulatory Visit (INDEPENDENT_AMBULATORY_CARE_PROVIDER_SITE_OTHER): Payer: BC Managed Care – PPO | Admitting: Internal Medicine

## 2022-07-01 VITALS — BP 122/74 | HR 70 | Temp 98.1°F | Resp 16 | Ht 70.0 in | Wt 187.1 lb

## 2022-07-01 DIAGNOSIS — Z0000 Encounter for general adult medical examination without abnormal findings: Secondary | ICD-10-CM | POA: Diagnosis not present

## 2022-07-01 DIAGNOSIS — Z23 Encounter for immunization: Secondary | ICD-10-CM | POA: Diagnosis not present

## 2022-07-01 NOTE — Progress Notes (Signed)
   Subjective:    Patient ID: Jeffery Rose, male    DOB: [birth year], 51 y.o.   MRN: [id number]  DOS:  07/01/2022 Type of visit - description: cpx  Since the last office visit is doing well. Still has occasional LUTS.  Sees urology.  Review of Systems  Other than above, a 14 point review of systems is negative     Past Medical History:  Diagnosis Date   Varicose veins    R leg     Past Surgical History:  Procedure Laterality Date   BACK SURGERY     2012, Dr Jeffery Rose   VARICOSE VEIN SURGERY     R, 2013   Social History   Socioeconomic History   Marital status: Married    Spouse name: Not on file   Number of children: 2   Years of education: Not on file   Highest education level: Not on file  Occupational History   Occupation: Sports administrator     Employer: [employer]  Tobacco Use   Smoking status: Former   Smokeless tobacco: Never  Substance and Sexual Activity   Alcohol use: Yes    Comment: couple beers weekly   Drug use: No   Sexual activity: Not on file  Other Topics Concern   Not on file  Social History Narrative   Original from [place], parents still there   2 children    1st child is a boy autistic born 2010   Daughter born 03-2018          Social Determinants of Corporate investment banker Strain: Not on file  Food Insecurity: Not on file  Transportation Needs: Not on file  Physical Activity: Not on file  Stress: Not on file  Social Connections: Not on file  Intimate Partner Violence: Not on file    No current outpatient medications      Objective:   Physical Exam BP 122/74   Pulse 70   Temp 98.1 F (36.7 C) (Oral)   Resp 16   Ht 5\' 10"  (1.778 m)   Wt 187 lb 2 oz (84.9 kg)   SpO2 97%   BMI 26.85 kg/m  General: Well developed, NAD, BMI noted Neck: No  thyromegaly  HEENT:  Normocephalic . Face symmetric, atraumatic Lungs:  CTA B Normal respiratory effort, no intercostal retractions, no accessory muscle use. Heart: RRR,  no  murmur.  Abdomen:  Not distended, soft, non-tender. No rebound or rigidity.   Lower extremities: no pretibial edema bilaterally  Skin: Exposed areas without rash. Not pale. Not jaundice Neurologic:  alert & oriented X3.  Speech normal, gait appropriate for age and unassisted Strength symmetric and appropriate for age.  Psych: Cognition and judgment appear intact.  Cooperative with normal attention span and concentration.  Behavior appropriate. No anxious or depressed appearing.     Assessment    ASSESSMENT Back surgery 2012 Varicose vein surgery, R leg, 2013 L hand rash: saw derm, rx steroids, dx eczema ? , uses prn  BPH, increase PSA: Urology at [facility]: Here for CPX BPH, increased PSA: Sees urology at [facility], has minimal symptoms, request a PSA.  Per chart review last PSA was 12/2021: 1.4. PSA will be drawn.  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 RTC 1 year

## 2022-07-01 NOTE — Patient Instructions (Addendum)
Vaccines I recommend:  Covid booster  Please return the stool sample    GO TO THE LAB : Get the blood work     GO TO THE FRONT DESK, PLEASE SCHEDULE YOUR APPOINTMENTS Come back for   a physical exam in 1 year

## 2022-07-02 ENCOUNTER — Encounter: Payer: Self-pay | Admitting: Internal Medicine

## 2022-07-02 LAB — CBC WITH DIFFERENTIAL/PLATELET
Absolute Monocytes: 358 cells/uL (ref 200–950)
Basophils Absolute: 20 cells/uL (ref 0–200)
Basophils Relative: 0.4 %
Eosinophils Absolute: 78 cells/uL (ref 15–500)
Eosinophils Relative: 1.6 %
HCT: 41.2 % (ref 38.5–50.0)
Hemoglobin: 14.5 g/dL (ref 13.2–17.1)
Lymphs Abs: 1798 cells/uL (ref 850–3900)
MCH: 30.9 pg (ref 27.0–33.0)
MCHC: 35.2 g/dL (ref 32.0–36.0)
MCV: 87.7 fL (ref 80.0–100.0)
MPV: 10.5 fL (ref 7.5–12.5)
Monocytes Relative: 7.3 %
Neutro Abs: 2646 cells/uL (ref 1500–7800)
Neutrophils Relative %: 54 %
Platelets: 210 10*3/uL (ref 140–400)
RBC: 4.7 10*6/uL (ref 4.20–5.80)
RDW: 12.3 % (ref 11.0–15.0)
Total Lymphocyte: 36.7 %
WBC: 4.9 10*3/uL (ref 3.8–10.8)

## 2022-07-02 LAB — COMPREHENSIVE METABOLIC PANEL
AG Ratio: 1.7 (calc) (ref 1.0–2.5)
ALT: 26 U/L (ref 9–46)
AST: 19 U/L (ref 10–40)
Albumin: 4.7 g/dL (ref 3.6–5.1)
Alkaline phosphatase (APISO): 62 U/L (ref 36–130)
BUN: 15 mg/dL (ref 7–25)
CO2: 28 mmol/L (ref 20–32)
Calcium: 9.8 mg/dL (ref 8.6–10.3)
Chloride: 103 mmol/L (ref 98–110)
Creat: 0.97 mg/dL (ref 0.60–1.29)
Globulin: 2.7 g/dL (calc) (ref 1.9–3.7)
Glucose, Bld: 75 mg/dL (ref 65–99)
Potassium: 5.2 mmol/L (ref 3.5–5.3)
Sodium: 140 mmol/L (ref 135–146)
Total Bilirubin: 0.8 mg/dL (ref 0.2–1.2)
Total Protein: 7.4 g/dL (ref 6.1–8.1)

## 2022-07-02 LAB — LIPID PANEL
Cholesterol: 158 mg/dL (ref <200)
HDL: 58 mg/dL (ref >=40)
LDL Cholesterol (Calc): 87 mg/dL (calc)
Non-HDL Cholesterol (Calc): 100 mg/dL (calc) (ref <130)
Total CHOL/HDL Ratio: 2.7 (calc) (ref <5.0)
Triglycerides: 50 mg/dL (ref <150)

## 2022-07-02 LAB — PSA: PSA: 2.79 ng/mL (ref <=4.00)

## 2022-07-02 NOTE — Assessment & Plan Note (Signed)
Here for CPX BPH, increased PSA: Sees urology at [facility], has minimal symptoms, request a PSA.  Per chart review last PSA was 12/2021: 1.4. PSA will be drawn.  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 RTC 1 year

## 2022-07-02 NOTE — Assessment & Plan Note (Signed)
-  Td 2017 -COVID vax booster recommended.    -Flu shot today - CCS: never had a cscope.  (-) IFOB  07/2019, iFOB 2022 failed, 3 options discussed again >> elected IFOB   -Prostate cancer screening: Sees urology regularly for elevated PSA  - Labs: CMP FLP CBC I-FOB PSA - Diet and exercise: Discussed

## 2022-08-01 ENCOUNTER — Other Ambulatory Visit (INDEPENDENT_AMBULATORY_CARE_PROVIDER_SITE_OTHER): Payer: BC Managed Care – PPO

## 2022-08-01 DIAGNOSIS — Z0000 Encounter for general adult medical examination without abnormal findings: Secondary | ICD-10-CM | POA: Diagnosis not present

## 2022-08-01 LAB — FECAL OCCULT BLOOD, IMMUNOCHEMICAL: Fecal Occult Bld: NEGATIVE

## 2022-08-15 ENCOUNTER — Encounter: Payer: Self-pay | Admitting: Internal Medicine

## 2022-10-25 ENCOUNTER — Ambulatory Visit (INDEPENDENT_AMBULATORY_CARE_PROVIDER_SITE_OTHER): Payer: BC Managed Care – PPO | Admitting: Medical

## 2022-10-25 ENCOUNTER — Encounter: Payer: Self-pay | Admitting: Medical

## 2022-10-25 VITALS — BP 110/68 | HR 86 | Temp 98.6°F | Resp 18 | Ht 70.0 in | Wt 189.0 lb

## 2022-10-25 DIAGNOSIS — J029 Acute pharyngitis, unspecified: Secondary | ICD-10-CM | POA: Diagnosis not present

## 2022-10-25 DIAGNOSIS — R059 Cough, unspecified: Secondary | ICD-10-CM | POA: Diagnosis not present

## 2022-10-25 DIAGNOSIS — R0981 Nasal congestion: Secondary | ICD-10-CM

## 2022-10-25 DIAGNOSIS — J301 Allergic rhinitis due to pollen: Secondary | ICD-10-CM

## 2022-10-25 DIAGNOSIS — J3489 Other specified disorders of nose and nasal sinuses: Secondary | ICD-10-CM | POA: Diagnosis not present

## 2022-10-25 LAB — POCT RAPID STREP A (OFFICE): Rapid Strep A Screen: POSITIVE — AB

## 2022-10-25 MED ORDER — FLUTICASONE PROPIONATE 50 MCG/ACT NA SUSP: 2.0000 | Freq: Every day | NASAL | 1 refills | Status: DC

## 2022-10-25 MED ORDER — AMOXICILLIN-POT CLAVULANATE 875-125 MG PO TABS: 1.0000 | ORAL_TABLET | Freq: Two times a day (BID) | ORAL | 0 refills | Status: DC

## 2022-10-25 NOTE — Progress Notes (Signed)
Subjective:    Patient ID: Jeffery Rose, male    DOB: Jun 12, 1971, 51 y.o.   MRN: [id number]  HPI  Pt states sick for about one week. Started about one week. At first nasal congestion, itching eyes and sneezing. He states pretty quickly felt feverish and chills. No body aches. Pt has some frontal and maxillary sinus pain. States more in forehead.   Pt states coughing up some mucus. Pt states feels some chest congestion.  Yesterday throat hurt to swallow. Also mild tender submandbular node yesterday.    Review of Systems  Constitutional:  Negative for chills, fatigue and fever.  HENT:  Positive for congestion, postnasal drip, sinus pressure and sore throat. Negative for ear pain.   Respiratory:  Negative for chest tightness, shortness of breath and wheezing.   Cardiovascular:  Negative for chest pain and palpitations.  Gastrointestinal:  Negative for abdominal pain, blood in stool and diarrhea.  Genitourinary:  Negative for dysuria, flank pain and frequency.  Musculoskeletal:  Negative for back pain and joint swelling.  Skin:  Negative for rash.  Neurological:  Negative for dizziness, seizures, weakness, light-headedness and headaches.  Hematological:  Negative for adenopathy. Does not bruise/bleed easily.  Psychiatric/Behavioral:  Negative for confusion.    Past Medical History:  Diagnosis Date   Varicose veins    R leg      Social History   Socioeconomic History   Marital status: Married    Spouse name: Not on file   Number of children: 2   Years of education: Not on file   Highest education level: Not on file  Occupational History   Occupation: Sports administrator     Employer: [employer]  Tobacco Use   Smoking status: Former   Smokeless tobacco: Never  Substance and Sexual Activity   Alcohol use: Yes    Comment: couple beers weekly   Drug use: No   Sexual activity: Not on file  Other Topics Concern   Not on file  Social History Narrative   Original from [place],  parents still there   2 children    1st child is a boy autistic born 2010   Daughter born 03-2018          Social Determinants of Corporate investment banker Strain: Not on file  Food Insecurity: Not on file  Transportation Needs: Not on file  Physical Activity: Not on file  Stress: Not on file  Social Connections: Not on file  Intimate Partner Violence: Not on file    Past Surgical History:  Procedure Laterality Date   BACK SURGERY     2012, Dr Danielle Dess   VARICOSE VEIN SURGERY     R, 2013    Family History  Problem Relation Age of Onset   Prostate cancer Father 7   Diabetes Neg Hx    Coronary artery disease Neg Hx    Stroke Neg Hx    Hypertension Neg Hx    Colon cancer Neg Hx     No Known Allergies  No current outpatient medications on file prior to visit.   No current facility-administered medications on file prior to visit.    BP 110/68   Pulse 86   Temp 98.6 F (37 C)   Resp 18   Ht  (1.778 m)   Wt 189 lb (85.7 kg)   SpO2 97%   BMI 27.12 kg/m        Objective:   Physical Exam  General Mental Status- Alert.  General Appearance- Not in acute distress.   Skin General: Color- Normal Color. Moisture- Normal Moisture.  Neck Carotid Arteries- Normal color. Moisture- Normal Moisture. No carotid bruits. No JVD.  Chest and Lung Exam Auscultation: Breath Sounds:-Normal.  Cardiovascular Auscultation:Rythm- Regular. Murmurs & Other Heart Sounds:Auscultation of the heart reveals- No Murmurs.  Abdomen Inspection:-Inspeection Normal. Palpation/Percussion:Note:No mass. Palpation and Percussion of the abdomen reveal- Non Tender, Non Distended + BS, no rebound or guarding.   Neurologic Cranial Nerve exam:- CN III-XII intact(No nystagmus), symmetric smile. Strength:- 5/5 equal and symmetric strength both upper and lower extremities.   Heent- nasal congestion, mild frontal sinus pressure and moderate tonsil hypertrophy with bright redness. Rt  tonsil mild larger than left. Rt submandibular faint enlarged.    Assessment & Plan:   Patient Instructions  Your strep test was positive today and recent allergy symptoms one week ago with some sinus pressure. I am prescribing augmentin  antibiotic. Tylenol for fever and warm salt water gargles.  Flonase nasal spray. Can use otc allergy eye drops as well. Can hold off presently on using oral anthistamine for one week.  I think washing nose with saline nasal spay in late afternoon good idea.   Follow up in 7-10  days or as needed.    Esperanza Richters, PA-C

## 2022-10-25 NOTE — Patient Instructions (Signed)
Your strep test was positive today and recent allergy symptoms one week ago with some sinus pressure. I am prescribing augmentin  antibiotic. Tylenol for fever and warm salt water gargles.  Flonase nasal spray. Can use otc allergy eye drops as well. Can hold off presently on using oral anthistamine for one week.  I think washing nose with saline nasal spay in late afternoon good idea.   Follow up in 7-10  days or as needed.

## 2022-12-13 DIAGNOSIS — N400 Enlarged prostate without lower urinary tract symptoms: Secondary | ICD-10-CM | POA: Diagnosis not present

## 2022-12-13 DIAGNOSIS — N401 Enlarged prostate with lower urinary tract symptoms: Secondary | ICD-10-CM | POA: Diagnosis not present

## 2022-12-13 DIAGNOSIS — R972 Elevated prostate specific antigen [PSA]: Secondary | ICD-10-CM | POA: Diagnosis not present

## 2022-12-13 DIAGNOSIS — N138 Other obstructive and reflux uropathy: Secondary | ICD-10-CM | POA: Diagnosis not present

## 2023-03-08 ENCOUNTER — Encounter: Payer: Self-pay | Admitting: Internal Medicine

## 2023-03-08 ENCOUNTER — Ambulatory Visit (INDEPENDENT_AMBULATORY_CARE_PROVIDER_SITE_OTHER): Payer: BC Managed Care – PPO | Admitting: Internal Medicine

## 2023-03-08 VITALS — BP 130/68 | HR 90 | Temp 98.8°F | Resp 16 | Ht 70.0 in | Wt 189.5 lb

## 2023-03-08 DIAGNOSIS — B349 Viral infection, unspecified: Secondary | ICD-10-CM | POA: Diagnosis not present

## 2023-03-08 LAB — POCT RAPID STREP A (OFFICE): Rapid Strep A Screen: NEGATIVE

## 2023-03-08 LAB — POC COVID19 BINAXNOW: SARS Coronavirus 2 Ag: NEGATIVE

## 2023-03-08 MED ORDER — BENZONATATE 200 MG PO CAPS: 200.0000 mg | ORAL_CAPSULE | Freq: Three times a day (TID) | ORAL | 0 refills | Status: DC | PRN

## 2023-03-08 MED ORDER — AZITHROMYCIN 250 MG PO TABS: ORAL_TABLET | ORAL | 0 refills | Status: DC

## 2023-03-08 NOTE — Progress Notes (Signed)
   Subjective:    Patient ID: Jeffery Rose, male    DOB: 02/01/1971, 52 y.o.   MRN: [id number]  DOS:  03/08/2023 Type of visit - description: Acute  Developed some chills cough and chest congestion 12 days ago.  Symptoms were mild and almost went away completely.  5 days ago he felt poorly again: Weak x 1 day. Cough with greenish sputum no hemoptysis + Subjective fever.  + Sore throat.  + Mild SOB but no CP + Sinus congestion. Symptoms increased at night Taking NyQuil.  His wife had similar but milder symptoms about 2 weeks ago.  Review of Systems See above   Past Medical History:  Diagnosis Date   Varicose veins    R leg     Past Surgical History:  Procedure Laterality Date   BACK SURGERY     2012, Dr Danielle Dess   VARICOSE VEIN SURGERY     R, 2013    Current Outpatient Medications  Medication Instructions   amoxicillin-clavulanate (AUGMENTIN) 875-125 MG tablet 1 tablet, Oral, 2 times daily   fluticasone (FLONASE) 50 MCG/ACT nasal spray 2 sprays, Each Nare, Daily       Objective:   Physical Exam BP 130/68   Pulse 90   Temp 98.8 F (37.1 C) (Oral)   Resp 16   Ht 5\' 10"  (1.778 m)   Wt 189 lb 8 oz (86 kg)   SpO2 96%   BMI 27.19 kg/m  General:   Well developed, NAD, BMI noted. HEENT:  Normocephalic . Face symmetric, atraumatic Ears: Unable to see the TM due to wax. Throat: Mildly red, no white patches, tonsils not enlarged.  Symmetric. Nose: Congested. Lungs:  CTA B Normal respiratory effort, no intercostal retractions, no accessory muscle use. Heart: RRR,  no murmur.  Lower extremities: no pretibial edema bilaterally  Skin: Not pale. Not jaundice Neurologic:  alert & oriented X3.  Speech normal, gait appropriate for age and unassisted Psych--  Cognition and judgment appear intact.  Cooperative with normal attention span and concentration.  Behavior appropriate. No anxious or depressed appearing.      Assessment   ASSESSMENT Back surgery  2012 Varicose vein surgery, R leg, 2013 L hand rash: saw derm, rx steroids, dx eczema ? , uses prn  BPH, increase PSA: Urology at [facility]  PLAN: Viral syndrome, bronchitis. Symptoms as described above, COVID test and rapid strep test negative today. Plan: Conservative treatment for few days, if no better Zithromax. See instructions.

## 2023-03-08 NOTE — Patient Instructions (Signed)
  Rest, fluids , tylenol  For cough:  Take Mucinex DM or Robitussin-DM OTC.  Follow the instructions in the box.   For nasal congestion: -Use over-the-counter Flonase: 2 nasal sprays on each side of the nose in the morning until you feel better  -Use OTC Astepro 2 nasal sprays on each side of the nose twice daily until better    Take Zithromax only if you are not better in the next 4 to 5 days.  Call if not gradually better over the next  10 days   Call anytime if the symptoms are severe, you have high fever, short of breath, chest pain

## 2023-03-08 NOTE — Assessment & Plan Note (Signed)
Viral syndrome, bronchitis. Symptoms as described above, COVID test and rapid strep test negative today. Plan: Conservative treatment for few days, if no better Zithromax. See instructions.

## 2023-07-03 ENCOUNTER — Ambulatory Visit (INDEPENDENT_AMBULATORY_CARE_PROVIDER_SITE_OTHER): Payer: BC Managed Care – PPO | Admitting: Internal Medicine

## 2023-07-03 ENCOUNTER — Encounter: Payer: Self-pay | Admitting: Internal Medicine

## 2023-07-03 VITALS — BP 126/70 | HR 98 | Temp 98.5°F | Resp 16 | Ht 70.0 in | Wt 194.5 lb

## 2023-07-03 DIAGNOSIS — N138 Other obstructive and reflux uropathy: Secondary | ICD-10-CM

## 2023-07-03 DIAGNOSIS — B349 Viral infection, unspecified: Secondary | ICD-10-CM

## 2023-07-03 DIAGNOSIS — N401 Enlarged prostate with lower urinary tract symptoms: Secondary | ICD-10-CM

## 2023-07-03 LAB — POCT INFLUENZA A/B
Influenza A, POC: NEGATIVE
Influenza B, POC: NEGATIVE

## 2023-07-03 LAB — POC COVID19 BINAXNOW: SARS Coronavirus 2 Ag: NEGATIVE

## 2023-07-03 MED ORDER — TAMSULOSIN HCL 0.4 MG PO CAPS: 0.4000 mg | ORAL_CAPSULE | Freq: Every day | ORAL | 3 refills | Status: DC

## 2023-07-03 NOTE — Progress Notes (Signed)
   Subjective:    Patient ID: Jeffery Rose, male    DOB: Jun 26, 1971, 52 y.o.   MRN: [id number]  DOS:  07/03/2023 Type of visit - description: Acute  Symptoms started 2 days ago: Runny nose, cough, postnasal dripping. Daughter had similar symptoms but she is getting better. Denies fever chills. No nausea vomiting. Admits to mild but unusual aches or pains. No chest pain, mild difficulty breathing mostly from nasal congestion.  Also, has BPH and chronic LUTS, treatment?  Denies blood in the urine.  Review of Systems See above   Past Medical History:  Diagnosis Date   Varicose veins    R leg     Past Surgical History:  Procedure Laterality Date   BACK SURGERY     2012, Dr Danielle Dess   VARICOSE VEIN SURGERY     R, 2013    No current outpatient medications      Objective:   Physical Exam BP 126/70   Pulse 98   Temp 98.5 F (36.9 C) (Oral)   Resp 16   Ht 5\' 10"  (1.778 m)   Wt 194 lb 8 oz (88.2 kg)   SpO2 94%   BMI 27.91 kg/m  General:   Well developed, NAD, BMI noted. HEENT:  Normocephalic . Face symmetric, atraumatic. Nose congested.  Throat with no white patches. TMs: Unable to see due to wax. Lungs:  CTA B Normal respiratory effort, no intercostal retractions, no accessory muscle use. Heart: RRR,  no murmur.  Lower extremities: no pretibial edema bilaterally  Skin: Not pale. Not jaundice Neurologic:  alert & oriented X3.  Speech normal, gait appropriate for age and unassisted Psych--  Cognition and judgment appear intact.  Cooperative with normal attention span and concentration.  Behavior appropriate. No anxious or depressed appearing.      Assessment   ASSESSMENT Back surgery 2012 Varicose vein surgery, R leg, 2013 L hand rash: saw derm, rx steroids, dx eczema ? , uses prn  BPH, increase PSA: Urology at [facility]  PLAN: Viral syndrome: COVID and flu test negative, recommend to recheck in 24 to 48 hours.  Conservative treatment.  Wear mask until he  is rule out.  See instructions. BPH, increased PSA: f/u  at [facility] urology, would like to try a medication to help with difficulty urinating which is mild. Chart is reviewed: In the past he tried Uroxatrol, Rapaflo (had side effects).  Plan: Trial with Flomax.  Rx sent

## 2023-07-03 NOTE — Assessment & Plan Note (Signed)
Viral syndrome: COVID and flu test negative, recommend to recheck in 24 to 48 hours.  Conservative treatment.  Wear mask until he is rule out.  See instructions. BPH, increased PSA: f/u  at [facility] urology, would like to try a medication to help with difficulty urinating which is mild. Chart is reviewed: In the past he tried Uroxatrol, Rapaflo (had side effects).  Plan: Trial with Flomax.  Rx sent

## 2023-07-03 NOTE — Patient Instructions (Addendum)
Reschedule your physical exam.  I sent tamsulosin 0.4 mg 1 tablet at bedtime to help with your prostate problems.   Recheck for COVID in 24 to 48 hours  Rest, fluids, Tylenol  Wear a mask   For cough:  Take Mucinex DM or Robitussin-DM OTC.  Follow the instructions in the box.   For nasal congestion: -Use OTC Astepro 2 nasal sprays on each side of the nose twice daily until better    Call if not gradually better over the next  10 days   Call anytime if the symptoms are severe, you have high fever, short of breath, chest pain

## 2023-08-15 ENCOUNTER — Ambulatory Visit: Payer: BC Managed Care – PPO | Admitting: Internal Medicine

## 2023-08-15 VITALS — BP 106/62 | HR 71 | Temp 98.1°F | Resp 16 | Ht 70.0 in | Wt 192.2 lb

## 2023-08-15 DIAGNOSIS — Z23 Encounter for immunization: Secondary | ICD-10-CM | POA: Diagnosis not present

## 2023-08-15 DIAGNOSIS — Z0000 Encounter for general adult medical examination without abnormal findings: Secondary | ICD-10-CM | POA: Diagnosis not present

## 2023-08-15 DIAGNOSIS — Z1211 Encounter for screening for malignant neoplasm of colon: Secondary | ICD-10-CM

## 2023-08-15 NOTE — Assessment & Plan Note (Signed)
Here for CPX. Doing well

## 2023-08-15 NOTE — Progress Notes (Signed)
Patient ID: Jeffery Rose, male    DOB: 02-22-1971, 53 y.o.   MRN: [id number]  DOS:  08/15/2023 Type of visit - description: CPX  Here for CPX. Recently was seen with a viral syndrome, he feels fully recuperated. LUTS minimal, well-controlled on Flomax.   Review of Systems  Other than above, a 14 point review of systems is negative     Past Medical History:  Diagnosis Date   Varicose veins    R leg     Past Surgical History:  Procedure Laterality Date   BACK SURGERY     2012, Dr Danielle Dess   VARICOSE VEIN SURGERY     R, 2013   Social History   Socioeconomic History   Marital status: Married    Spouse name: Not on file   Number of children: 2   Years of education: Not on file   Highest education level: 12th grade  Occupational History   Occupation: Sports administrator     Employer: [employer]  Tobacco Use   Smoking status: Former   Smokeless tobacco: Never  Substance and Sexual Activity   Alcohol use: Yes    Comment: couple beers weekly   Drug use: No   Sexual activity: Not on file  Other Topics Concern   Not on file  Social History Narrative   Original from [place], parents still there   2 children    1st child is a boy autistic born 2010   Daughter born 03-2018          Social Drivers of Corporate investment banker Strain: Low Risk  (08/15/2023)   Overall Financial Resource Strain (CARDIA)    Difficulty of Paying Living Expenses: Not hard at all  Food Insecurity: No Food Insecurity (08/15/2023)   Hunger Vital Sign    Worried About Running Out of Food in the Last Year: Never true    Ran Out of Food in the Last Year: Never true  Transportation Needs: No Transportation Needs (08/15/2023)   PRAPARE - Administrator, Civil Service (Medical): No    Lack of Transportation (Non-Medical): No  Physical Activity: Insufficiently Active (08/15/2023)   Exercise Vital Sign    Days of Exercise per Week: 2 days    Minutes of Exercise per Session: 20 min   Stress: No Stress Concern Present (08/15/2023)   [employer] of Occupational Health - Occupational Stress Questionnaire    Feeling of Stress : Only a little  Social Connections: Moderately Integrated (08/15/2023)   Social Connection and Isolation Panel [NHANES]    Frequency of Communication with Friends and Family: More than three times a week    Frequency of Social Gatherings with Friends and Family: Once a week    Attends Religious Services: 1 to 4 times per year    Active Member of [employer] or Organizations: No    Attends Engineer, structural: Not on file    Marital Status: Married  Catering manager Violence: Not on file    Current Outpatient Medications  Medication Instructions   tamsulosin (FLOMAX) 0.4 mg, Oral, Daily       Objective:   Physical Exam BP 106/62   Pulse 71   Temp 98.1 F (36.7 C) (Oral)   Resp 16   Ht 5\' 10"  (1.778 m)   Wt 192 lb 4 oz (87.2 kg)   SpO2 97%   BMI 27.59 kg/m  General: Well developed, NAD, BMI noted Neck: No  thyromegaly  HEENT:  Normocephalic . Face symmetric, atraumatic Lungs:  CTA B Normal respiratory effort, no intercostal retractions, no accessory muscle use. Heart: RRR,  no murmur.  Abdomen:  Not distended, soft, non-tender. No rebound or rigidity.   Lower extremities: no pretibial edema bilaterally  Skin: Exposed areas without rash. Not pale. Not jaundice Neurologic:  alert & oriented X3.  Speech normal, gait appropriate for age and unassisted Strength symmetric and appropriate for age.  Psych: Cognition and judgment appear intact.  Cooperative with normal attention span and concentration.  Behavior appropriate. No anxious or depressed appearing.     Assessment    Problem list Back surgery 2012 Varicose vein surgery, R leg, 2013 L hand rash: saw derm, rx steroids, dx eczema ? , uses prn  BPH, increase PSA: Urology at [facility]: Here for CPX - Td 2017 - Flu shot today -Recommend a covid booster, he  would be very hesitant to proceed. - CCS: never had a cscope.  Previous IFOB negative.  3 options discussed, elected Cologuard.  Referral sent, recommend to check coverage with his insurance company. -Prostate cancer screening:  BPH, + LUTS, elevated PSA: Follow-up [facility], LOV 12/13/2022.  PSA was 1.5. They rec DRE and PSA in 2026. 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  On Flomax, symptoms controlled.  - Labs: CMP FLP CBC TSH - Diet and exercise: We talk about the importance of carve out some time out of his schedule to exercise more.  Encouraged to continue eating healthy with fruits, vegetables and decrease excessive carbohydrates. RTC 1 year

## 2023-08-15 NOTE — Assessment & Plan Note (Addendum)
Here for CPX - Td 2017 - Flu shot today -Recommend a covid booster, he would be very hesitant to proceed. - CCS: never had a cscope.  Previous IFOB negative.  3 options discussed, elected Cologuard.  Referral sent, recommend to check coverage with his insurance company. -Prostate cancer screening:  BPH, + LUTS, elevated PSA: Follow-up [facility], LOV 12/13/2022.  PSA was 1.5. They rec DRE and PSA in 2026. 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  On Flomax, symptoms controlled.  - Labs: CMP FLP CBC TSH - Diet and exercise: We talk about the importance of carve out some time out of his schedule to exercise more.  Encouraged to continue eating healthy with fruits, vegetables and decrease excessive carbohydrates.

## 2023-08-15 NOTE — Patient Instructions (Addendum)
It was good to see you today  We are asking the Cologuard company to send you a box.  If you do not hear from them in the next couple of weeks please let us know    GO TO THE LAB : Get the blood work     Next visit with me in 1 year for a physical exam. Please schedule it at the front desk

## 2023-08-16 LAB — COMPREHENSIVE METABOLIC PANEL
ALT: 33 U/L (ref 0–53)
AST: 25 U/L (ref 0–37)
Albumin: 4.7 g/dL (ref 3.5–5.2)
Alkaline Phosphatase: 58 U/L (ref 39–117)
BUN: 15 mg/dL (ref 6–23)
CO2: 30 meq/L (ref 19–32)
Calcium: 9.5 mg/dL (ref 8.4–10.5)
Chloride: 101 meq/L (ref 96–112)
Creatinine, Ser: 1 mg/dL (ref 0.40–1.50)
GFR: 87.57 mL/min (ref >60.00)
Glucose, Bld: 62 mg/dL — ABNORMAL LOW (ref 70–99)
Potassium: 4.4 meq/L (ref 3.5–5.1)
Sodium: 140 meq/L (ref 135–145)
Total Bilirubin: 0.8 mg/dL (ref 0.2–1.2)
Total Protein: 7.3 g/dL (ref 6.0–8.3)

## 2023-08-16 LAB — LIPID PANEL
Cholesterol: 164 mg/dL (ref 0–200)
HDL: 60.3 mg/dL (ref >39.00)
LDL Cholesterol: 91 mg/dL (ref 0–99)
NonHDL: 103.82
Total CHOL/HDL Ratio: 3
Triglycerides: 64 mg/dL (ref 0.0–149.0)
VLDL: 12.8 mg/dL (ref 0.0–40.0)

## 2023-08-16 LAB — CBC WITH DIFFERENTIAL/PLATELET
Basophils Absolute: 0 10*3/uL (ref 0.0–0.1)
Basophils Relative: 0.6 % (ref 0.0–3.0)
Eosinophils Absolute: 0.2 10*3/uL (ref 0.0–0.7)
Eosinophils Relative: 3.5 % (ref 0.0–5.0)
HCT: 41.6 % (ref 39.0–52.0)
Hemoglobin: 14.2 g/dL (ref 13.0–17.0)
Lymphocytes Relative: 34.8 % (ref 12.0–46.0)
Lymphs Abs: 2 10*3/uL (ref 0.7–4.0)
MCHC: 34.1 g/dL (ref 30.0–36.0)
MCV: 90 fL (ref 78.0–100.0)
Monocytes Absolute: 0.4 10*3/uL (ref 0.1–1.0)
Monocytes Relative: 7 % (ref 3.0–12.0)
Neutro Abs: 3.1 10*3/uL (ref 1.4–7.7)
Neutrophils Relative %: 54.1 % (ref 43.0–77.0)
Platelets: 233 10*3/uL (ref 150.0–400.0)
RBC: 4.62 Mil/uL (ref 4.22–5.81)
RDW: 13.2 % (ref 11.5–15.5)
WBC: 5.8 10*3/uL (ref 4.0–10.5)

## 2023-08-16 LAB — TSH: TSH: 1.52 u[IU]/mL (ref 0.35–5.50)

## 2023-08-17 ENCOUNTER — Encounter: Payer: Self-pay | Admitting: Internal Medicine

## 2023-09-11 DIAGNOSIS — Z1211 Encounter for screening for malignant neoplasm of colon: Secondary | ICD-10-CM | POA: Diagnosis not present

## 2023-09-15 LAB — COLOGUARD: COLOGUARD: NEGATIVE

## 2023-11-30 DIAGNOSIS — H6123 Impacted cerumen, bilateral: Secondary | ICD-10-CM | POA: Diagnosis not present

## 2024-03-01 ENCOUNTER — Other Ambulatory Visit (HOSPITAL_BASED_OUTPATIENT_CLINIC_OR_DEPARTMENT_OTHER): Payer: Self-pay

## 2024-03-01 ENCOUNTER — Ambulatory Visit: Payer: Self-pay

## 2024-03-01 ENCOUNTER — Ambulatory Visit: Admitting: Family

## 2024-03-01 VITALS — BP 115/63 | HR 89 | Temp 99.0°F | Resp 16 | Ht 70.0 in | Wt 190.0 lb

## 2024-03-01 DIAGNOSIS — R300 Dysuria: Secondary | ICD-10-CM

## 2024-03-01 DIAGNOSIS — R109 Unspecified abdominal pain: Secondary | ICD-10-CM

## 2024-03-01 DIAGNOSIS — N401 Enlarged prostate with lower urinary tract symptoms: Secondary | ICD-10-CM

## 2024-03-01 DIAGNOSIS — N138 Other obstructive and reflux uropathy: Secondary | ICD-10-CM

## 2024-03-01 LAB — POC URINALSYSI DIPSTICK (AUTOMATED)
Blood, UA: NEGATIVE
Glucose, UA: NEGATIVE
Nitrite, UA: NEGATIVE
Protein, UA: NEGATIVE
Spec Grav, UA: 1.015 (ref 1.010–1.025)
Urobilinogen, UA: 0.2 U/dL
pH, UA: 6 (ref 5.0–8.0)

## 2024-03-01 MED ORDER — TAMSULOSIN HCL 0.4 MG PO CAPS
0.4000 mg | ORAL_CAPSULE | Freq: Every day | ORAL | 0 refills | Status: DC
  Filled 2024-03-01: qty 90, 90d supply, fill #0

## 2024-03-01 MED ORDER — CIPROFLOXACIN HCL 500 MG PO TABS
500.0000 mg | ORAL_TABLET | Freq: Two times a day (BID) | ORAL | 0 refills | Status: AC
  Filled 2024-03-01: qty 14, 7d supply, fill #0

## 2024-03-01 MED ORDER — METRONIDAZOLE 500 MG PO TABS
500.0000 mg | ORAL_TABLET | Freq: Three times a day (TID) | ORAL | 0 refills | Status: AC
  Filled 2024-03-01: qty 21, 7d supply, fill #0

## 2024-03-01 NOTE — Assessment & Plan Note (Signed)
  Symptoms suggest possible urinary tract or prostate infection. Urinalysis inconclusive. - Empiric treatment with cipro  to cover for prostatitis/uti. - Prescribed Flomax  to improve urinary symptoms. - Sent urine for culture. - Advised ER visit if symptoms worsen over the weekend. - Follow up with PCP next week.

## 2024-03-01 NOTE — Progress Notes (Signed)
 Subjective:     Patient ID: Jeffery Rose, male    DOB: Feb 27, 1971, 53 y.o.   MRN: [id number]  Chief Complaint  Patient presents with   Dysuria    Patient complains of some pain and burning with urination.    Benign Prostatic Hypertrophy    Patient reports history of bph, have not taking flomax  in 4-5 months.   Urinary Retention    Patient reports hard to urinate, straining to urinate    Dysuria   Benign Prostatic Hypertrophy Associated symptoms include dysuria.    Discussed the use of AI scribe software for clinical note transcription with the patient, who gave verbal consent to proceed.  History of Present Illness  Jeffery Rose is a 53 year old male with a history of urinary tract infections who presents with urinary symptoms.  He began experiencing urinary symptoms yesterday, including difficulty urinating and a weak stream, similar to past episodes. He discontinued Flomax  four months ago due to perceived ineffectiveness.  He has an upset stomach and a feeling of fullness despite not eating all day. Abdominal discomfort is rated as 2 out of 10 on the pain scale. Bowel movements are normal, and there is no vomiting.  He feels very weak and experiences headaches. There is no fever or respiratory symptoms. Similar symptoms have occurred in the past with urinary tract infections, including weakness and abdominal discomfort.     Health Maintenance Due  Topic Date Due   Hepatitis B Vaccines 19-59 Average Risk (1 of 3 - 19+ 3-dose series) Never done   Pneumococcal Vaccine: 50+ Years (1 of 1 - PCV) Never done   Zoster Vaccines- Shingrix (1 of 2) Never done   COVID-19 Vaccine (4 - 2024-25 season) 03/05/2023   INFLUENZA VACCINE  02/02/2024    Past Medical History:  Diagnosis Date   Varicose veins    R leg     Past Surgical History:  Procedure Laterality Date   BACK SURGERY     2012, Dr Colon   VARICOSE VEIN SURGERY     R, 2013    Family History  Problem  Relation Age of Onset   Prostate cancer Father 61   Diabetes Neg Hx    Coronary artery disease Neg Hx    Stroke Neg Hx    Hypertension Neg Hx    Colon cancer Neg Hx     Social History   Socioeconomic History   Marital status: Married    Spouse name: Not on file   Number of children: 2   Years of education: Not on file   Highest education level: 12th grade  Occupational History   Occupation: Sports administrator     Employer: [employer]  Tobacco Use   Smoking status: Former   Smokeless tobacco: Never  Substance and Sexual Activity   Alcohol use: Yes    Comment: couple beers weekly   Drug use: No   Sexual activity: Not on file  Other Topics Concern   Not on file  Social History Narrative   Original from [place], parents still there   2 children    1st child is a boy autistic born 2010   Daughter born 03-2018          Social Drivers of Corporate investment banker Strain: Low Risk  (08/15/2023)   Overall Financial Resource Strain (CARDIA)    Difficulty of Paying Living Expenses: Not hard at all  Food Insecurity: No Food Insecurity (08/15/2023)   Hunger Vital Sign  Worried About Programme researcher, broadcasting/film/video in the Last Year: Never true    Ran Out of Food in the Last Year: Never true  Transportation Needs: No Transportation Needs (08/15/2023)   PRAPARE - Administrator, Civil Service (Medical): No    Lack of Transportation (Non-Medical): No  Physical Activity: Insufficiently Active (08/15/2023)   Exercise Vital Sign    Days of Exercise per Week: 2 days    Minutes of Exercise per Session: 20 min  Stress: No Stress Concern Present (08/15/2023)   [employer] of Occupational Health - Occupational Stress Questionnaire    Feeling of Stress : Only a little  Social Connections: Moderately Integrated (08/15/2023)   Social Connection and Isolation Panel    Frequency of Communication with Friends and Family: More than three times a week    Frequency of Social Gatherings with  Friends and Family: Once a week    Attends Religious Services: 1 to 4 times per year    Active Member of [employer] or Organizations: No    Attends Engineer, structural: Not on file    Marital Status: Married  Catering manager Violence: Not on file    Outpatient Medications Prior to Visit  Medication Sig Dispense Refill   tamsulosin  (FLOMAX ) 0.4 MG CAPS capsule Take 1 capsule (0.4 mg total) by mouth daily. 30 capsule 3   No facility-administered medications prior to visit.    No Known Allergies  Review of Systems  Genitourinary:  Positive for dysuria.       Objective:    Physical Exam Constitutional:      General: He is not in acute distress.    Appearance: He is well-developed.  HENT:     Head: Normocephalic and atraumatic.  Cardiovascular:     Rate and Rhythm: Normal rate and regular rhythm.     Heart sounds: No murmur heard. Pulmonary:     Effort: Pulmonary effort is normal. No respiratory distress.     Breath sounds: Normal breath sounds. No wheezing or rales.  Abdominal:     General: Bowel sounds are normal.     Palpations: Abdomen is soft.     Tenderness: There is abdominal tenderness in the right lower quadrant and left lower quadrant. There is no guarding or rebound.  Skin:    General: Skin is warm and dry.  Neurological:     Mental Status: He is alert and oriented to person, place, and time.  Psychiatric:        Behavior: Behavior normal.        Thought Content: Thought content normal.      BP 115/63 (BP Location: Right Arm, Patient Position: Sitting, Cuff Size: Normal)   Pulse 89   Temp 99 F (37.2 C) (Oral)   Resp 16   Ht 5' 10 (1.778 m)   Wt 190 lb (86.2 kg)   SpO2 96%   BMI 27.26 kg/m  Wt Readings from Last 3 Encounters:  03/01/24 190 lb (86.2 kg)  08/15/23 192 lb 4 oz (87.2 kg)  07/03/23 194 lb 8 oz (88.2 kg)       Assessment & Plan:   Problem List Items Addressed This Visit       Unprioritized   BPH with obstruction/lower  urinary tract symptoms    Symptoms suggest possible urinary tract or prostate infection. Urinalysis inconclusive. - Empiric treatment with cipro  to cover for prostatitis/uti. - Prescribed Flomax  to improve urinary symptoms. - Sent urine for culture. - Advised  ER visit if symptoms worsen over the weekend. - Follow up with PCP next week.       Relevant Medications   tamsulosin  (FLOMAX ) 0.4 MG CAPS capsule   Abdominal pain   Patient presented at the end of the day after lab/imaging closed.  He has never had a colonoscopy, so I don't know if he has hx of diverticulosis, however clinically I have concern for diverticulitis.  Will begin empiric cipro /flagyl  over the weekend. Pt is advised to go to ER if symptoms worsen over the weekend and he is agreeable to this plan.       Relevant Medications   ciprofloxacin  (CIPRO ) 500 MG tablet   metroNIDAZOLE  (FLAGYL ) 500 MG tablet   Other Visit Diagnoses       Dysuria    -  Primary   Relevant Orders   POCT Urinalysis Dipstick (Automated) (Completed)   Urine Culture       I am having Kym Boan start on ciprofloxacin  and metroNIDAZOLE . I am also having him maintain his tamsulosin .  Meds ordered this encounter  Medications   ciprofloxacin  (CIPRO ) 500 MG tablet    Sig: Take 1 tablet (500 mg total) by mouth 2 (two) times daily for 7 days.    Dispense:  14 tablet    Refill:  0    Supervising Provider:   DOMENICA BLACKBIRD A [[id number]]   metroNIDAZOLE  (FLAGYL ) 500 MG tablet    Sig: Take 1 tablet (500 mg total) by mouth 3 (three) times daily for 7 days.    Dispense:  21 tablet    Refill:  0    Supervising Provider:   DOMENICA BLACKBIRD A [[id number]]   tamsulosin  (FLOMAX ) 0.4 MG CAPS capsule    Sig: Take 1 capsule (0.4 mg total) by mouth daily.    Dispense:  90 capsule    Refill:  0    Supervising Provider:   DOMENICA BLACKBIRD A [[id number]]

## 2024-03-01 NOTE — Assessment & Plan Note (Signed)
 Patient presented at the end of the day after lab/imaging closed.  He has never had a colonoscopy, so I don't know if he has hx of diverticulosis, however clinically I have concern for diverticulitis.  Will begin empiric cipro /flagyl  over the weekend. Pt is advised to go to ER if symptoms worsen over the weekend and he is agreeable to this plan.

## 2024-03-01 NOTE — Telephone Encounter (Signed)
 FYI Only or Action Required?: FYI only for provider.  Patient was last seen in primary care on 08/15/2023 by Amon Aloysius BRAVO, MD.  Called Nurse Triage reporting Weakness and Abdominal Pain.  Symptoms began yesterday.  Interventions attempted: OTC medications: Tums and Other: self induced vomiting yesterday .  Symptoms are: mild abdominal discomfort since yesterday after eating lunch (lamb, yogurt, potatoes); nausea; headache; burning/pain with urination (since yesterday); fatigue; fullness/bloating gradually worsening.  Triage Disposition: See HCP Within 4 Hours (Or PCP Triage)  Patient/caregiver understands and will follow disposition?: Yes           Copied from CRM #[id number]. Topic: Clinical - Red Word Triage >> Mar 01, 2024  2:06 PM Robinson H wrote: [facility] that prompted transfer to Nurse Triage: Stomach pain started yesterday, very weak Reason for Disposition  [1] MILD-MODERATE pain AND [2] constant AND [3] present > 2 hours  Answer Assessment - Initial Assessment Questions 1. LOCATION: Where does it hurt?      Lower, bilateral sides.  2. RADIATION: Does the pain shoot anywhere else? (e.g., chest, back)     No.  3. ONSET: When did the pain begin? (Minutes, hours or days ago)      Yesterday, after eating his lunch (lamb with lemon potatoes and yogurt). He states he felt weak. He states he was able to walk. He states that is also when he had the pain and nausea. He states he tried to make himself throw up for relief and states he was able to vomit a couple of times but still had the discomfort. He states this morning he did not feel better.  4. SUDDEN: Gradual or sudden onset?     Gradual.  5. PATTERN Does the pain come and go, or is it constant?     Comes and goes, but then states not really comes and goes and is still there and he can feel it.  6. SEVERITY: How bad is the pain?  (e.g., Scale 1-10; mild, moderate, or severe)     States it feels like  discomfort, 2-3/10.  7. RECURRENT SYMPTOM: Have you ever had this type of stomach pain before? If Yes, ask: When was the last time? and What happened that time?      No.  8. CAUSE: What do you think is causing the stomach pain? (e.g., gallstones, recent abdominal surgery)     No.  9. RELIEVING/AGGRAVATING FACTORS: What makes it better or worse? (e.g., antacids, bending or twisting motion, bowel movement)     He states he took Tums but feels like it only helped a little. He states walking around is worse. Resting or lying down helps.  10. OTHER SYMPTOMS: Do you have any other symptoms? (e.g., back pain, diarrhea, fever, urination pain, vomiting)       Sensation of fullness, bloating, nausea, self induced vomiting x coupe of times yesterday, fatigue, headache, burning/pain with urination (yesterday). Denies diarrhea, constipation, fever, known exposure to flu or COVID, blood in urine.  He states he has a history of UTIs and enlarged prostate.  Protocols used: Abdominal Pain - Male-A-AH

## 2024-03-01 NOTE — Patient Instructions (Signed)
 VISIT SUMMARY:  You visited us  today due to urinary symptoms and abdominal discomfort. We discussed your symptoms, and you were prescribed medications to help with these issues. We also provided guidance on what to do if your symptoms worsen.  YOUR PLAN:  URINARY SYMPTOMS: You are experiencing difficulty urinating and a weak stream, which may indicate a urinary tract or prostate infection. -You have been prescribed Flomax  to help improve your urinary symptoms. -We sent your urine for a culture to identify any infection. -If your symptoms worsen over the weekend, please visit the ER. -Cipro  antibiotic will cover both a urinary tract infection and prostate infection just in case.  ABDOMINAL PAIN: You have abdominal discomfort that may be due to diverticulitis. -You have been prescribed antibiotics to treat both diverticulitis and a possible urinary tract infection. -We provided you with information on diverticulitis. -If your symptoms worsen over the weekend, please visit the ER.

## 2024-03-02 LAB — URINE CULTURE
MICRO NUMBER:: 16902546
Result:: NO GROWTH
SPECIMEN QUALITY:: ADEQUATE

## 2024-03-05 ENCOUNTER — Ambulatory Visit: Payer: Self-pay | Admitting: Family

## 2024-03-06 ENCOUNTER — Telehealth: Payer: Self-pay | Admitting: Internal Medicine

## 2024-03-06 NOTE — Telephone Encounter (Signed)
 Before I call Pt- you saw him on 03/01/24 for abd pain and dysuria, were you wanting labs on him?

## 2024-03-06 NOTE — Telephone Encounter (Signed)
 I have reviewed chart- I don't see anything in chart where Pt is needing repeat blood work? Please advise?

## 2024-03-06 NOTE — Telephone Encounter (Signed)
 Spoke w/ Pt- he thought he made an appt w/ PCP for tomorrow, explained PCP will be on vacation until 03/19/24. Pt states he is doing better, no pain. Appt scheduled for when PCP returns, 03/22/24. Pt made aware if pain comes back or becomes worse, to call and we will schedule an appt w/ another provider. Pt verbalized understanding.

## 2024-03-06 NOTE — Telephone Encounter (Signed)
 Good morning I need lab orders for 9/4

## 2024-03-06 NOTE — Telephone Encounter (Signed)
 I had advised pt to see PCP this week for follow up of his abdominal pain. He came in at 4:45 on Friday before the long weekend and I did the best I could without labs or imaging but told him he needed close follow up.   Pertinent labs can be ordered at time of follow up.

## 2024-03-06 NOTE — Telephone Encounter (Signed)
 I do not see that he needs any additional blood work until he comes back for a physical 08/2024

## 2024-03-07 ENCOUNTER — Other Ambulatory Visit

## 2024-03-22 ENCOUNTER — Ambulatory Visit (INDEPENDENT_AMBULATORY_CARE_PROVIDER_SITE_OTHER): Admitting: Internal Medicine

## 2024-03-22 ENCOUNTER — Ambulatory Visit: Payer: Self-pay | Admitting: Internal Medicine

## 2024-03-22 ENCOUNTER — Encounter: Payer: Self-pay | Admitting: Internal Medicine

## 2024-03-22 VITALS — BP 106/64 | HR 65 | Temp 98.1°F | Resp 16 | Ht 70.0 in | Wt 192.2 lb

## 2024-03-22 DIAGNOSIS — N138 Other obstructive and reflux uropathy: Secondary | ICD-10-CM

## 2024-03-22 DIAGNOSIS — N401 Enlarged prostate with lower urinary tract symptoms: Secondary | ICD-10-CM

## 2024-03-22 LAB — PSA: PSA: 2.25 ng/mL (ref 0.10–4.00)

## 2024-03-22 NOTE — Progress Notes (Signed)
 Patient ID: Jeffery Rose, male    DOB: [birth year], 53 y.o.   MRN: [id number]  DOS:  03/22/2024 Type of visit - description: Follow-up  Develop LUTS: Some dysuria, difficulty urinating. Denies penile discharge or rash. Was seen here, prescribed antibiotics. He feels back to normal.  At no point had fever or chills.  No nausea vomiting.  No blood in the urine. No diarrhea or constipation   Review of Systems  Past Medical History:  Diagnosis Date   Varicose veins    R leg     Past Surgical History:  Procedure Laterality Date   BACK SURGERY     2012, Dr Colon   VARICOSE VEIN SURGERY     R, 2013   Social History   Socioeconomic History   Marital status: Married    Spouse name: Not on file   Number of children: 2   Years of education: Not on file   Highest education level: 12th grade  Occupational History   Occupation: Sports administrator     Employer: [employer]  Tobacco Use   Smoking status: Former   Smokeless tobacco: Never  Substance and Sexual Activity   Alcohol use: Yes    Comment: couple beers weekly   Drug use: No   Sexual activity: Not on file  Other Topics Concern   Not on file  Social History Narrative   Original from [place], parents still there   2 children    1st child is a boy autistic born 2010   Daughter born 03-2018          Social Drivers of Corporate investment banker Strain: Low Risk  (08/15/2023)   Overall Financial Resource Strain (CARDIA)    Difficulty of Paying Living Expenses: Not hard at all  Food Insecurity: No Food Insecurity (08/15/2023)   Hunger Vital Sign    Worried About Running Out of Food in the Last Year: Never true    Ran Out of Food in the Last Year: Never true  Transportation Needs: No Transportation Needs (08/15/2023)   PRAPARE - Administrator, Civil Service (Medical): No    Lack of Transportation (Non-Medical): No  Physical Activity: Insufficiently Active (08/15/2023)   Exercise Vital Sign    Days of  Exercise per Week: 2 days    Minutes of Exercise per Session: 20 min  Stress: No Stress Concern Present (08/15/2023)   [employer] of Occupational Health - Occupational Stress Questionnaire    Feeling of Stress : Only a little  Social Connections: Moderately Integrated (08/15/2023)   Social Connection and Isolation Panel    Frequency of Communication with Friends and Family: More than three times a week    Frequency of Social Gatherings with Friends and Family: Once a week    Attends Religious Services: 1 to 4 times per year    Active Member of [employer] or Organizations: No    Attends Engineer, structural: Not on file    Marital Status: Married  Catering manager Violence: Not on file    Current Outpatient Medications  Medication Instructions   tamsulosin  (FLOMAX ) 0.4 mg, Oral, Daily       Objective:   Physical Exam BP 106/64   Pulse 65   Temp 98.1 F (36.7 C) (Oral)   Resp 16   Ht 5' 10 (1.778 m)   Wt 192 lb 4 oz (87.2 kg)   SpO2 98%   BMI 27.59 kg/m    General:  Well developed, NAD, BMI noted.  HEENT:  Normocephalic . Face symmetric, atraumatic   Abdomen:  Not distended, soft, non-tender. No rebound or rigidity. DRE: Normal sphincter tone, normal stools.  Prostate normal Skin: Not pale. Not jaundice Lower extremities: no pretibial edema bilaterally  Neurologic:  alert & oriented X3.  Speech normal, gait appropriate for age and unassisted Psych--  Cognition and judgment appear intact.  Cooperative with normal attention span and concentration.  Behavior appropriate. No anxious or depressed appearing.  Assessment    Problem list Back surgery 2012 Varicose vein surgery, R leg, 2013 L hand rash: saw derm, rx steroids, dx eczema ? , uses prn  BPH, increase PSA: Urology at [facility]  PLAN: BPH Seen 03/01/2024.  Symptoms possibly due to UTI/prostatitis/diverticulitis in the context of stopping Flomax  two months prior. Was Rx Cipro  and Flagyl  empirically,  restarted Flomax .  UCX negative. He is now back to normal, DRE negative, for completeness we will check a PSA (last PSA at [facility] 12/2022 was 1.5.  Next visit w/[facility] 2026). Recommend to stay on Flomax . RTC scheduled for 08/2024 CPX

## 2024-03-22 NOTE — Assessment & Plan Note (Signed)
 BPH Seen 03/01/2024.  Symptoms possibly due to UTI/prostatitis/diverticulitis in the context of stopping Flomax  two months prior. Was Rx Cipro  and Flagyl  empirically, restarted Flomax .  UCX negative. He is now back to normal, DRE negative, for completeness we will check a PSA (last PSA at [facility] 12/2022 was 1.5.  Next visit w/[facility] 2026). Recommend to stay on Flomax . RTC scheduled for 08/2024 CPX

## 2024-03-22 NOTE — Patient Instructions (Addendum)
 Get your blood work.  Continue with Flomax  daily  Drink plenty of water  See you in February for your physical exam.  Call sooner if needed.

## 2024-05-09 DIAGNOSIS — H6123 Impacted cerumen, bilateral: Secondary | ICD-10-CM | POA: Diagnosis not present

## 2024-05-14 DIAGNOSIS — H52203 Unspecified astigmatism, bilateral: Secondary | ICD-10-CM | POA: Diagnosis not present

## 2024-05-14 DIAGNOSIS — H532 Diplopia: Secondary | ICD-10-CM | POA: Diagnosis not present

## 2024-06-17 ENCOUNTER — Other Ambulatory Visit (HOSPITAL_BASED_OUTPATIENT_CLINIC_OR_DEPARTMENT_OTHER): Payer: Self-pay

## 2024-06-17 ENCOUNTER — Other Ambulatory Visit: Payer: Self-pay | Admitting: Family

## 2024-06-17 DIAGNOSIS — N138 Other obstructive and reflux uropathy: Secondary | ICD-10-CM

## 2024-06-17 MED ORDER — TAMSULOSIN HCL 0.4 MG PO CAPS
0.4000 mg | ORAL_CAPSULE | Freq: Every day | ORAL | 0 refills | Status: AC
  Filled 2024-06-17: qty 90, 90d supply, fill #0

## 2024-08-19 ENCOUNTER — Encounter: Payer: BC Managed Care – PPO | Admitting: Internal Medicine
